# Patient Record
Sex: Male | Born: 1940 | Race: White | Hispanic: No | Marital: Married | Smoking: Former smoker
Health system: Southern US, Community
[De-identification: ages and names within clinical notes are randomized; demographics above are authoritative.]

## PROBLEM LIST (undated history)

## (undated) DIAGNOSIS — M1990 Unspecified osteoarthritis, unspecified site: Secondary | ICD-10-CM

## (undated) DIAGNOSIS — Z8619 Personal history of other infectious and parasitic diseases: Secondary | ICD-10-CM

## (undated) DIAGNOSIS — C801 Malignant (primary) neoplasm, unspecified: Secondary | ICD-10-CM

## (undated) DIAGNOSIS — M26609 Unspecified temporomandibular joint disorder, unspecified side: Secondary | ICD-10-CM

## (undated) DIAGNOSIS — Z87442 Personal history of urinary calculi: Secondary | ICD-10-CM

## (undated) DIAGNOSIS — I10 Essential (primary) hypertension: Secondary | ICD-10-CM

## (undated) DIAGNOSIS — Z8501 Personal history of malignant neoplasm of esophagus: Secondary | ICD-10-CM

## (undated) DIAGNOSIS — Z974 Presence of external hearing-aid: Secondary | ICD-10-CM

## (undated) HISTORY — PX: TONSILLECTOMY AND ADENOIDECTOMY: SHX28

## (undated) HISTORY — PX: VASECTOMY: SHX75

## (undated) HISTORY — DX: Personal history of other infectious and parasitic diseases: Z86.19

## (undated) HISTORY — PX: APPENDECTOMY: SHX54

## (undated) HISTORY — DX: Personal history of urinary calculi: Z87.442

## (undated) HISTORY — DX: Essential (primary) hypertension: I10

## (undated) HISTORY — DX: Personal history of malignant neoplasm of esophagus: Z85.01

---
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
## 1968-03-29 HISTORY — PX: CHOLECYSTECTOMY: SHX55

## 1968-03-29 HISTORY — PX: HERNIA REPAIR: SHX51

## 1999-10-27 LAB — HM DEXA SCAN

## 2001-03-29 HISTORY — PX: OTHER SURGICAL HISTORY: SHX169

## 2007-08-03 DIAGNOSIS — I10 Essential (primary) hypertension: Secondary | ICD-10-CM | POA: Insufficient documentation

## 2008-03-27 ENCOUNTER — Emergency Department (HOSPITAL_COMMUNITY): Admission: EM | Admit: 2008-03-27 | Discharge: 2008-03-27 | Payer: Self-pay | Admitting: Emergency Medicine

## 2011-01-01 LAB — CBC
MCHC: 34.3 g/dL (ref 30.0–36.0)
MCV: 95 fL (ref 78.0–100.0)
Platelets: 203 10*3/uL (ref 150–400)
RDW: 13.1 % (ref 11.5–15.5)

## 2011-01-01 LAB — URINALYSIS, ROUTINE W REFLEX MICROSCOPIC
Bilirubin Urine: NEGATIVE
Hgb urine dipstick: NEGATIVE
Protein, ur: NEGATIVE mg/dL
Urobilinogen, UA: 0.2 mg/dL (ref 0.0–1.0)

## 2011-01-01 LAB — DIFFERENTIAL
Eosinophils Relative: 3 % (ref 0–5)
Lymphocytes Relative: 25 % (ref 12–46)
Lymphs Abs: 2.1 10*3/uL (ref 0.7–4.0)
Monocytes Absolute: 0.8 10*3/uL (ref 0.1–1.0)

## 2011-01-01 LAB — COMPREHENSIVE METABOLIC PANEL
AST: 24 U/L (ref 0–37)
Albumin: 4.1 g/dL (ref 3.5–5.2)
Calcium: 9 mg/dL (ref 8.4–10.5)
Creatinine, Ser: 1.15 mg/dL (ref 0.4–1.5)
GFR calc Af Amer: >60 mL/min (ref >60)

## 2011-01-01 LAB — LIPASE, BLOOD: Lipase: 25 U/L (ref 11–59)

## 2011-01-18 ENCOUNTER — Ambulatory Visit: Payer: Self-pay | Admitting: Family Medicine

## 2011-05-14 DIAGNOSIS — M542 Cervicalgia: Secondary | ICD-10-CM | POA: Diagnosis not present

## 2011-05-18 DIAGNOSIS — M542 Cervicalgia: Secondary | ICD-10-CM | POA: Diagnosis not present

## 2011-05-24 DIAGNOSIS — M542 Cervicalgia: Secondary | ICD-10-CM | POA: Diagnosis not present

## 2011-05-27 DIAGNOSIS — M542 Cervicalgia: Secondary | ICD-10-CM | POA: Diagnosis not present

## 2011-06-01 DIAGNOSIS — M542 Cervicalgia: Secondary | ICD-10-CM | POA: Diagnosis not present

## 2011-06-03 DIAGNOSIS — M542 Cervicalgia: Secondary | ICD-10-CM | POA: Diagnosis not present

## 2011-06-08 DIAGNOSIS — M542 Cervicalgia: Secondary | ICD-10-CM | POA: Diagnosis not present

## 2011-06-10 DIAGNOSIS — M542 Cervicalgia: Secondary | ICD-10-CM | POA: Diagnosis not present

## 2011-06-17 DIAGNOSIS — M542 Cervicalgia: Secondary | ICD-10-CM | POA: Diagnosis not present

## 2011-06-22 DIAGNOSIS — M542 Cervicalgia: Secondary | ICD-10-CM | POA: Diagnosis not present

## 2011-06-29 DIAGNOSIS — M542 Cervicalgia: Secondary | ICD-10-CM | POA: Diagnosis not present

## 2011-07-01 DIAGNOSIS — M542 Cervicalgia: Secondary | ICD-10-CM | POA: Diagnosis not present

## 2011-07-02 DIAGNOSIS — M5412 Radiculopathy, cervical region: Secondary | ICD-10-CM | POA: Diagnosis not present

## 2011-07-02 DIAGNOSIS — M542 Cervicalgia: Secondary | ICD-10-CM | POA: Diagnosis not present

## 2011-07-06 DIAGNOSIS — M5412 Radiculopathy, cervical region: Secondary | ICD-10-CM | POA: Diagnosis not present

## 2011-07-06 DIAGNOSIS — M542 Cervicalgia: Secondary | ICD-10-CM | POA: Diagnosis not present

## 2011-07-08 DIAGNOSIS — M5412 Radiculopathy, cervical region: Secondary | ICD-10-CM | POA: Diagnosis not present

## 2011-07-08 DIAGNOSIS — M542 Cervicalgia: Secondary | ICD-10-CM | POA: Diagnosis not present

## 2011-07-08 DIAGNOSIS — M4802 Spinal stenosis, cervical region: Secondary | ICD-10-CM | POA: Diagnosis not present

## 2011-07-16 DIAGNOSIS — M5020 Other cervical disc displacement, unspecified cervical region: Secondary | ICD-10-CM | POA: Diagnosis not present

## 2011-07-19 DIAGNOSIS — M5412 Radiculopathy, cervical region: Secondary | ICD-10-CM | POA: Diagnosis not present

## 2011-07-22 DIAGNOSIS — M5412 Radiculopathy, cervical region: Secondary | ICD-10-CM | POA: Diagnosis not present

## 2011-07-26 DIAGNOSIS — M5412 Radiculopathy, cervical region: Secondary | ICD-10-CM | POA: Diagnosis not present

## 2011-11-09 DIAGNOSIS — C269 Malignant neoplasm of ill-defined sites within the digestive system: Secondary | ICD-10-CM | POA: Insufficient documentation

## 2011-11-09 DIAGNOSIS — C494 Malignant neoplasm of connective and soft tissue of abdomen: Secondary | ICD-10-CM | POA: Diagnosis not present

## 2011-11-09 DIAGNOSIS — K222 Esophageal obstruction: Secondary | ICD-10-CM | POA: Insufficient documentation

## 2011-11-09 DIAGNOSIS — C159 Malignant neoplasm of esophagus, unspecified: Secondary | ICD-10-CM | POA: Diagnosis not present

## 2012-06-12 DIAGNOSIS — I10 Essential (primary) hypertension: Secondary | ICD-10-CM | POA: Diagnosis not present

## 2012-06-23 DIAGNOSIS — I10 Essential (primary) hypertension: Secondary | ICD-10-CM | POA: Diagnosis not present

## 2012-06-23 DIAGNOSIS — Z1212 Encounter for screening for malignant neoplasm of rectum: Secondary | ICD-10-CM | POA: Diagnosis not present

## 2012-06-23 DIAGNOSIS — Z0000 Encounter for general adult medical examination without abnormal findings: Secondary | ICD-10-CM | POA: Diagnosis not present

## 2012-06-23 DIAGNOSIS — Z8501 Personal history of malignant neoplasm of esophagus: Secondary | ICD-10-CM | POA: Diagnosis not present

## 2012-08-02 DIAGNOSIS — H43819 Vitreous degeneration, unspecified eye: Secondary | ICD-10-CM | POA: Diagnosis not present

## 2013-06-25 DIAGNOSIS — Z23 Encounter for immunization: Secondary | ICD-10-CM | POA: Diagnosis not present

## 2013-10-25 DIAGNOSIS — K7689 Other specified diseases of liver: Secondary | ICD-10-CM | POA: Diagnosis not present

## 2013-10-25 DIAGNOSIS — K449 Diaphragmatic hernia without obstruction or gangrene: Secondary | ICD-10-CM | POA: Diagnosis not present

## 2013-10-25 DIAGNOSIS — Z01811 Encounter for preprocedural respiratory examination: Secondary | ICD-10-CM | POA: Diagnosis not present

## 2013-10-25 DIAGNOSIS — N201 Calculus of ureter: Secondary | ICD-10-CM | POA: Diagnosis not present

## 2013-10-26 DIAGNOSIS — N209 Urinary calculus, unspecified: Secondary | ICD-10-CM | POA: Diagnosis not present

## 2013-10-26 DIAGNOSIS — N200 Calculus of kidney: Secondary | ICD-10-CM | POA: Diagnosis not present

## 2013-10-26 DIAGNOSIS — I10 Essential (primary) hypertension: Secondary | ICD-10-CM | POA: Diagnosis not present

## 2013-10-26 DIAGNOSIS — N201 Calculus of ureter: Secondary | ICD-10-CM | POA: Diagnosis not present

## 2013-10-27 DIAGNOSIS — I498 Other specified cardiac arrhythmias: Secondary | ICD-10-CM | POA: Diagnosis not present

## 2013-10-29 ENCOUNTER — Encounter: Payer: Self-pay | Admitting: *Deleted

## 2013-11-02 DIAGNOSIS — Z09 Encounter for follow-up examination after completed treatment for conditions other than malignant neoplasm: Secondary | ICD-10-CM | POA: Diagnosis not present

## 2013-11-02 DIAGNOSIS — C494 Malignant neoplasm of connective and soft tissue of abdomen: Secondary | ICD-10-CM | POA: Diagnosis not present

## 2013-11-02 DIAGNOSIS — Z8589 Personal history of malignant neoplasm of other organs and systems: Secondary | ICD-10-CM | POA: Diagnosis not present

## 2013-11-06 DIAGNOSIS — K222 Esophageal obstruction: Secondary | ICD-10-CM | POA: Diagnosis not present

## 2013-11-06 DIAGNOSIS — K4091 Unilateral inguinal hernia, without obstruction or gangrene, recurrent: Secondary | ICD-10-CM | POA: Diagnosis not present

## 2013-11-06 DIAGNOSIS — C494 Malignant neoplasm of connective and soft tissue of abdomen: Secondary | ICD-10-CM | POA: Diagnosis not present

## 2013-11-08 DIAGNOSIS — N201 Calculus of ureter: Secondary | ICD-10-CM | POA: Diagnosis not present

## 2013-11-08 DIAGNOSIS — N200 Calculus of kidney: Secondary | ICD-10-CM | POA: Diagnosis not present

## 2013-11-13 ENCOUNTER — Ambulatory Visit: Payer: Self-pay | Admitting: General Surgery

## 2013-11-20 DIAGNOSIS — N200 Calculus of kidney: Secondary | ICD-10-CM | POA: Diagnosis not present

## 2013-11-22 ENCOUNTER — Encounter: Payer: Self-pay | Admitting: *Deleted

## 2013-11-26 DIAGNOSIS — I10 Essential (primary) hypertension: Secondary | ICD-10-CM | POA: Diagnosis not present

## 2013-11-26 DIAGNOSIS — Z87442 Personal history of urinary calculi: Secondary | ICD-10-CM | POA: Diagnosis not present

## 2013-11-26 DIAGNOSIS — K4090 Unilateral inguinal hernia, without obstruction or gangrene, not specified as recurrent: Secondary | ICD-10-CM | POA: Diagnosis not present

## 2013-11-26 DIAGNOSIS — Z1212 Encounter for screening for malignant neoplasm of rectum: Secondary | ICD-10-CM | POA: Diagnosis not present

## 2013-12-30 DIAGNOSIS — K4090 Unilateral inguinal hernia, without obstruction or gangrene, not specified as recurrent: Secondary | ICD-10-CM | POA: Insufficient documentation

## 2013-12-31 DIAGNOSIS — K4090 Unilateral inguinal hernia, without obstruction or gangrene, not specified as recurrent: Secondary | ICD-10-CM | POA: Diagnosis not present

## 2014-01-21 DIAGNOSIS — Z23 Encounter for immunization: Secondary | ICD-10-CM | POA: Diagnosis not present

## 2014-02-21 DIAGNOSIS — H43813 Vitreous degeneration, bilateral: Secondary | ICD-10-CM | POA: Diagnosis not present

## 2014-05-24 DIAGNOSIS — K4090 Unilateral inguinal hernia, without obstruction or gangrene, not specified as recurrent: Secondary | ICD-10-CM | POA: Diagnosis not present

## 2014-05-24 DIAGNOSIS — I10 Essential (primary) hypertension: Secondary | ICD-10-CM | POA: Diagnosis not present

## 2014-05-24 DIAGNOSIS — Z8500 Personal history of malignant neoplasm of unspecified digestive organ: Secondary | ICD-10-CM | POA: Diagnosis not present

## 2014-06-19 DIAGNOSIS — L570 Actinic keratosis: Secondary | ICD-10-CM | POA: Diagnosis not present

## 2014-06-19 DIAGNOSIS — L578 Other skin changes due to chronic exposure to nonionizing radiation: Secondary | ICD-10-CM | POA: Diagnosis not present

## 2014-06-19 DIAGNOSIS — L820 Inflamed seborrheic keratosis: Secondary | ICD-10-CM | POA: Diagnosis not present

## 2014-06-19 DIAGNOSIS — L821 Other seborrheic keratosis: Secondary | ICD-10-CM | POA: Diagnosis not present

## 2014-07-08 DIAGNOSIS — K4090 Unilateral inguinal hernia, without obstruction or gangrene, not specified as recurrent: Secondary | ICD-10-CM | POA: Diagnosis not present

## 2014-08-09 DIAGNOSIS — H2513 Age-related nuclear cataract, bilateral: Secondary | ICD-10-CM | POA: Diagnosis not present

## 2014-08-09 DIAGNOSIS — H43813 Vitreous degeneration, bilateral: Secondary | ICD-10-CM | POA: Diagnosis not present

## 2014-08-14 DIAGNOSIS — L570 Actinic keratosis: Secondary | ICD-10-CM | POA: Diagnosis not present

## 2014-08-14 DIAGNOSIS — L820 Inflamed seborrheic keratosis: Secondary | ICD-10-CM | POA: Diagnosis not present

## 2014-09-27 DIAGNOSIS — H2513 Age-related nuclear cataract, bilateral: Secondary | ICD-10-CM | POA: Diagnosis not present

## 2014-09-27 DIAGNOSIS — H43813 Vitreous degeneration, bilateral: Secondary | ICD-10-CM | POA: Diagnosis not present

## 2015-01-17 DIAGNOSIS — Z23 Encounter for immunization: Secondary | ICD-10-CM | POA: Diagnosis not present

## 2015-04-03 DIAGNOSIS — M1612 Unilateral primary osteoarthritis, left hip: Secondary | ICD-10-CM | POA: Diagnosis not present

## 2015-04-03 DIAGNOSIS — S76012A Strain of muscle, fascia and tendon of left hip, initial encounter: Secondary | ICD-10-CM | POA: Diagnosis not present

## 2015-04-03 DIAGNOSIS — M25552 Pain in left hip: Secondary | ICD-10-CM | POA: Diagnosis not present

## 2015-04-03 DIAGNOSIS — M7542 Impingement syndrome of left shoulder: Secondary | ICD-10-CM | POA: Diagnosis not present

## 2015-04-25 DIAGNOSIS — M7062 Trochanteric bursitis, left hip: Secondary | ICD-10-CM | POA: Diagnosis not present

## 2015-04-25 DIAGNOSIS — M25612 Stiffness of left shoulder, not elsewhere classified: Secondary | ICD-10-CM | POA: Diagnosis not present

## 2015-04-25 DIAGNOSIS — M25552 Pain in left hip: Secondary | ICD-10-CM | POA: Diagnosis not present

## 2015-04-25 DIAGNOSIS — R2689 Other abnormalities of gait and mobility: Secondary | ICD-10-CM | POA: Diagnosis not present

## 2015-04-25 DIAGNOSIS — M25512 Pain in left shoulder: Secondary | ICD-10-CM | POA: Diagnosis not present

## 2015-05-01 ENCOUNTER — Ambulatory Visit (INDEPENDENT_AMBULATORY_CARE_PROVIDER_SITE_OTHER): Payer: Medicare Other | Admitting: Physician Assistant

## 2015-05-01 ENCOUNTER — Encounter: Payer: Self-pay | Admitting: Physician Assistant

## 2015-05-01 VITALS — BP 130/60 | HR 95 | Temp 99.4°F | Resp 16 | Wt 158.0 lb

## 2015-05-01 DIAGNOSIS — R197 Diarrhea, unspecified: Secondary | ICD-10-CM | POA: Diagnosis not present

## 2015-05-01 DIAGNOSIS — Z8619 Personal history of other infectious and parasitic diseases: Secondary | ICD-10-CM

## 2015-05-01 DIAGNOSIS — I10 Essential (primary) hypertension: Secondary | ICD-10-CM | POA: Diagnosis not present

## 2015-05-01 DIAGNOSIS — R110 Nausea: Secondary | ICD-10-CM | POA: Diagnosis not present

## 2015-05-01 MED ORDER — METOPROLOL SUCCINATE ER 50 MG PO TB24: 50.0000 mg | ORAL_TABLET | Freq: Every day | ORAL | Status: DC

## 2015-05-01 MED ORDER — ONDANSETRON HCL 4 MG PO TABS: 4.0000 mg | ORAL_TABLET | Freq: Three times a day (TID) | ORAL | Status: DC | PRN

## 2015-05-01 MED ORDER — AMLODIPINE BESY-BENAZEPRIL HCL 5-20 MG PO CAPS: 1.0000 | ORAL_CAPSULE | Freq: Every day | ORAL | Status: DC

## 2015-05-01 MED ORDER — CIPROFLOXACIN HCL 500 MG PO TABS: 500.0000 mg | ORAL_TABLET | Freq: Two times a day (BID) | ORAL | Status: DC

## 2015-05-01 NOTE — Patient Instructions (Signed)
Diarrhea Diarrhea is frequent loose and watery bowel movements. It can cause you to feel weak and dehydrated. Dehydration can cause you to become tired and thirsty, have a dry mouth, and have decreased urination that often is dark yellow. Diarrhea is a sign of another problem, most often an infection that will not last long. In most cases, diarrhea typically lasts 2-3 days. However, it can last longer if it is a sign of something more serious. It is important to treat your diarrhea as directed by your caregiver to lessen or prevent future episodes of diarrhea. CAUSES  Some common causes include:  Gastrointestinal infections caused by viruses, bacteria, or parasites.  Food poisoning or food allergies.  Certain medicines, such as antibiotics, chemotherapy, and laxatives.  Artificial sweeteners and fructose.  Digestive disorders. HOME CARE INSTRUCTIONS  Ensure adequate fluid intake (hydration): Have 1 cup (8 oz) of fluid for each diarrhea episode. Avoid fluids that contain simple sugars or sports drinks, fruit juices, whole milk products, and sodas. Your urine should be clear or pale yellow if you are drinking enough fluids. Hydrate with an oral rehydration solution that you can purchase at pharmacies, retail stores, and online. You can prepare an oral rehydration solution at home by mixing the following ingredients together:   - tsp table salt.   tsp baking soda.   tsp salt substitute containing potassium chloride.  1  tablespoons sugar.  1 L (34 oz) of water.  Certain foods and beverages may increase the speed at which food moves through the gastrointestinal (GI) tract. These foods and beverages should be avoided and include:  Caffeinated and alcoholic beverages.  High-fiber foods, such as raw fruits and vegetables, nuts, seeds, and whole grain breads and cereals.  Foods and beverages sweetened with sugar alcohols, such as xylitol, sorbitol, and mannitol.  Some foods may be well  tolerated and may help thicken stool including:  Starchy foods, such as rice, toast, pasta, low-sugar cereal, oatmeal, grits, baked potatoes, crackers, and bagels.  Bananas.  Applesauce.  Add probiotic-rich foods to help increase healthy bacteria in the GI tract, such as yogurt and fermented milk products.  Wash your hands well after each diarrhea episode.  Only take over-the-counter or prescription medicines as directed by your caregiver.  Take a warm bath to relieve any burning or pain from frequent diarrhea episodes. SEEK IMMEDIATE MEDICAL CARE IF:   You are unable to keep fluids down.  You have persistent vomiting.  You have blood in your stool, or your stools are black and tarry.  You do not urinate in 6-8 hours, or there is only a small amount of very dark urine.  You have abdominal pain that increases or localizes.  You have weakness, dizziness, confusion, or light-headedness.  You have a severe headache.  Your diarrhea gets worse or does not get better.  You have a fever or persistent symptoms for more than 2-3 days.  You have a fever and your symptoms suddenly get worse. MAKE SURE YOU:   Understand these instructions.  Will watch your condition.  Will get help right away if you are not doing well or get worse.   This information is not intended to replace advice given to you by your health care provider. Make sure you discuss any questions you have with your health care provider.   Document Released: 03/05/2002 Document Revised: 04/05/2014 Document Reviewed: 11/21/2011 Elsevier Interactive Patient Education 2016 Elsevier Inc.   Food Choices to Help Relieve Diarrhea, Adult When you have   diarrhea, the foods you eat and your eating habits are very important. Choosing the right foods and drinks can help relieve diarrhea. Also, because diarrhea can last up to 7 days, you need to replace lost fluids and electrolytes (such as sodium, potassium, and chloride) in  order to help prevent dehydration.  WHAT GENERAL GUIDELINES DO I NEED TO FOLLOW?  Slowly drink 1 cup (8 oz) of fluid for each episode of diarrhea. If you are getting enough fluid, your urine will be clear or pale yellow.  Eat starchy foods. Some good choices include white rice, white toast, pasta, low-fiber cereal, baked potatoes (without the skin), saltine crackers, and bagels.  Avoid large servings of any cooked vegetables.  Limit fruit to two servings per day. A serving is  cup or 1 small piece.  Choose foods with less than 2 g of fiber per serving.  Limit fats to less than 8 tsp (38 g) per day.  Avoid fried foods.  Eat foods that have probiotics in them. Probiotics can be found in certain dairy products.  Avoid foods and beverages that may increase the speed at which food moves through the stomach and intestines (gastrointestinal tract). Things to avoid include:  High-fiber foods, such as dried fruit, raw fruits and vegetables, nuts, seeds, and whole grain foods.  Spicy foods and high-fat foods.  Foods and beverages sweetened with high-fructose corn syrup, honey, or sugar alcohols such as xylitol, sorbitol, and mannitol. WHAT FOODS ARE RECOMMENDED? Grains White rice. White, French, or pita breads (fresh or toasted), including plain rolls, buns, or bagels. White pasta. Saltine, soda, or graham crackers. Pretzels. Low-fiber cereal. Cooked cereals made with water (such as cornmeal, farina, or cream cereals). Plain muffins. Matzo. Melba toast. Zwieback.  Vegetables Potatoes (without the skin). Strained tomato and vegetable juices. Most well-cooked and canned vegetables without seeds. Tender lettuce. Fruits Cooked or canned applesauce, apricots, cherries, fruit cocktail, grapefruit, peaches, pears, or plums. Fresh bananas, apples without skin, cherries, grapes, cantaloupe, grapefruit, peaches, oranges, or plums.  Meat and Other Protein Products Baked or boiled chicken. Eggs. Tofu.  Fish. Seafood. Smooth peanut butter. Ground or well-cooked tender beef, ham, veal, lamb, pork, or poultry.  Dairy Plain yogurt, kefir, and unsweetened liquid yogurt. Lactose-free milk, buttermilk, or soy milk. Plain hard cheese. Beverages Sport drinks. Clear broths. Diluted fruit juices (except prune). Regular, caffeine-free sodas such as ginger ale. Water. Decaffeinated teas. Oral rehydration solutions. Sugar-free beverages not sweetened with sugar alcohols. Other Bouillon, broth, or soups made from recommended foods.  The items listed above may not be a complete list of recommended foods or beverages. Contact your dietitian for more options. WHAT FOODS ARE NOT RECOMMENDED? Grains Whole grain, whole wheat, bran, or rye breads, rolls, pastas, crackers, and cereals. Wild or brown rice. Cereals that contain more than 2 g of fiber per serving. Corn tortillas or taco shells. Cooked or dry oatmeal. Granola. Popcorn. Vegetables Raw vegetables. Cabbage, broccoli, Brussels sprouts, artichokes, baked beans, beet greens, corn, kale, legumes, peas, sweet potatoes, and yams. Potato skins. Cooked spinach and cabbage. Fruits Dried fruit, including raisins and dates. Raw fruits. Stewed or dried prunes. Fresh apples with skin, apricots, mangoes, pears, raspberries, and strawberries.  Meat and Other Protein Products Chunky peanut butter. Nuts and seeds. Beans and lentils. Bacon.  Dairy High-fat cheeses. Milk, chocolate milk, and beverages made with milk, such as milk shakes. Cream. Ice cream. Sweets and Desserts Sweet rolls, doughnuts, and sweet breads. Pancakes and waffles. Fats and Oils Butter. Cream sauces.   Margarine. Salad oils. Plain salad dressings. Olives. Avocados.  Beverages Caffeinated beverages (such as coffee, tea, soda, or energy drinks). Alcoholic beverages. Fruit juices with pulp. Prune juice. Soft drinks sweetened with high-fructose corn syrup or sugar alcohols. Other Coconut. Hot sauce.  Chili powder. Mayonnaise. Gravy. Cream-based or milk-based soups.  The items listed above may not be a complete list of foods and beverages to avoid. Contact your dietitian for more information. WHAT SHOULD I DO IF I BECOME DEHYDRATED? Diarrhea can sometimes lead to dehydration. Signs of dehydration include dark urine and dry mouth and skin. If you think you are dehydrated, you should rehydrate with an oral rehydration solution. These solutions can be purchased at pharmacies, retail stores, or online.  Drink -1 cup (120-240 mL) of oral rehydration solution each time you have an episode of diarrhea. If drinking this amount makes your diarrhea worse, try drinking smaller amounts more often. For example, drink 1-3 tsp (5-15 mL) every 5-10 minutes.  A general rule for staying hydrated is to drink 1-2 L of fluid per day. Talk to your health care provider about the specific amount you should be drinking each day. Drink enough fluids to keep your urine clear or pale yellow.   This information is not intended to replace advice given to you by your health care provider. Make sure you discuss any questions you have with your health care provider.   Document Released: 06/05/2003 Document Revised: 04/05/2014 Document Reviewed: 02/05/2013 Elsevier Interactive Patient Education 2016 Elsevier Inc.   

## 2015-05-01 NOTE — Progress Notes (Signed)
Patient: [name] Male    DOB: May 23, 1940   75 y.o.   MRN: [id number] Visit Date: 05/01/2015  Today's Provider: Mar Daring, PA-C   Chief Complaint  Patient presents with  . Abdominal Pain  . Diarrhea   Subjective:    Abdominal Pain This is a new problem. The current episode started in the past 7 days (for the past two days). The onset quality is sudden. The problem occurs constantly. The problem has been unchanged. The pain is located in the RLQ and LLQ. The pain is at a severity of 4/10. The pain is mild. The quality of the pain is cramping and aching. The abdominal pain does not radiate. Associated symptoms include diarrhea, a fever and headaches. Pertinent negatives include no constipation, nausea or vomiting. Associated symptoms comments: Low grade fever 99.7. The pain is aggravated by palpation. The pain is relieved by liquids. He has tried nothing for the symptoms.  Diarrhea  This is a new problem. The current episode started in the past 7 days (For the past two days). The problem occurs less than 2 times per day. The problem has been gradually worsening. The stool consistency is described as watery (it was watery). Associated symptoms include abdominal pain, bloating, a fever and headaches. Pertinent negatives include no chills, coughing or vomiting. There are no known risk factors. He has tried increased fluids for the symptoms. The treatment provided no relief.  Had large episode of diarrhea twice 2 days ago.  One loose stool yesterday and none since. 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  This was done at [facility] and he is followed every 2 years with CT scans.      Allergies  Allergen Reactions  . Codeine Phosphate [Codeine] Other (See Comments) and Hives    Dizziness   Previous Medications   AMLODIPINE-BENAZEPRIL (LOTREL) 5-20 MG CAPSULE    Take by mouth.   FAMOTIDINE-CALCIUM  CARBONATE-MAGNESIUM HYDROXIDE (PEPCID COMPLETE) [phone] MG CHEWABLE TABLET    Chew by mouth daily as needed (as needed).    METOPROLOL SUCCINATE (TOPROL-XL) 50 MG 24 HR TABLET       MULTIPLE VITAMINS TABLET    Take by mouth.    Review of Systems  Constitutional: Positive for fever and fatigue. Negative for chills.  Respiratory: Negative for cough, chest tightness, shortness of breath and wheezing.   Cardiovascular: Negative for chest pain, palpitations and leg swelling.  Gastrointestinal: Positive for abdominal pain, diarrhea and bloating. Negative for nausea, vomiting, constipation, blood in stool and anal bleeding.  Musculoskeletal: Negative.   Neurological: Positive for headaches. Negative for dizziness, syncope and weakness.  All other systems reviewed and are negative.   Social History  Substance Use Topics  . Smoking status: Former Research scientist (life sciences)  . Smokeless tobacco: Not on file  . Alcohol Use: No   Objective:   BP 130/60 mmHg  Pulse 95  Temp(Src) 99.4 F (37.4 C) (Oral)  Resp 16  Wt 158 lb (71.668 kg)  Physical Exam  Constitutional: He is oriented to person, place, and time. He appears well-developed and well-nourished. No distress.  HENT:  Head: Normocephalic and atraumatic.  Mouth/Throat: Oropharynx is clear and moist. No oropharyngeal exudate.  Neck: Normal range of motion. Neck supple. No tracheal deviation present. No thyromegaly present.  Cardiovascular: Normal rate, regular rhythm and normal heart sounds.  Exam reveals no gallop and no friction rub.   No murmur  heard. Pulmonary/Chest: Effort normal and breath sounds normal. No respiratory distress. He has no wheezes. He has no rales.  Abdominal: Soft. Normal appearance and bowel sounds are normal. He exhibits no distension and no mass. There is no hepatosplenomegaly. There is generalized tenderness. There is no rebound, no guarding and no CVA tenderness.  Lymphadenopathy:    He has no cervical adenopathy.    Neurological: He is alert and oriented to person, place, and time.  Skin: Skin is warm and dry. He is not diaphoretic.  Vitals reviewed.       Assessment & Plan:     1. Diarrhea, unspecified type Explained that this is most likely viral and self-limited.  He should stay well hydrated.  May use tylenol for fevers and pain.  May continue to use pepto bismol if it is helping diarrhea and abdominal cramping. Will give cipro that he may fill if necessary if bowel movements increase in frequency or become bloody over the weekend. If not he does not have to fill this medication. Also warned that if he is to develop inability to pass gas for him to go to the hospital as he may have a bowel obstruction. He voiced understanding. He is to call the office if symptoms fail to improve or worsen. - ciprofloxacin (CIPRO) 500 MG tablet; Take 1 tablet (500 mg total) by mouth 2 (two) times daily.  Dispense: 6 tablet; Refill: 0  2. Nausea Will give zofran as needed for symptomatic relief of nausea. - ondansetron (ZOFRAN) 4 MG tablet; Take 1 tablet (4 mg total) by mouth every 8 (eight) hours as needed for nausea or vomiting.  Dispense: 20 tablet; Refill: 0  3. Essential hypertension Stable.  Diagnosis pulled to refill medications. Continue current medical treatment plan. - amLODipine-benazepril (LOTREL) 5-20 MG capsule; Take 1 capsule by mouth daily.  Dispense: 90 capsule; Refill: 3 - metoprolol succinate (TOPROL-XL) 50 MG 24 hr tablet; Take 1 tablet (50 mg total) by mouth daily.  Dispense: 90 tablet; Refill: [place], PA-C  [facility]

## 2015-05-06 DIAGNOSIS — M25512 Pain in left shoulder: Secondary | ICD-10-CM | POA: Diagnosis not present

## 2015-05-06 DIAGNOSIS — M25552 Pain in left hip: Secondary | ICD-10-CM | POA: Diagnosis not present

## 2015-05-08 DIAGNOSIS — M25512 Pain in left shoulder: Secondary | ICD-10-CM | POA: Diagnosis not present

## 2015-05-08 DIAGNOSIS — M25552 Pain in left hip: Secondary | ICD-10-CM | POA: Diagnosis not present

## 2015-05-08 DIAGNOSIS — R2689 Other abnormalities of gait and mobility: Secondary | ICD-10-CM | POA: Diagnosis not present

## 2015-05-30 DIAGNOSIS — M25512 Pain in left shoulder: Secondary | ICD-10-CM | POA: Diagnosis not present

## 2015-05-30 DIAGNOSIS — M25552 Pain in left hip: Secondary | ICD-10-CM | POA: Diagnosis not present

## 2015-06-03 ENCOUNTER — Encounter: Payer: Self-pay | Admitting: *Deleted

## 2015-06-03 DIAGNOSIS — M25512 Pain in left shoulder: Secondary | ICD-10-CM | POA: Diagnosis not present

## 2015-06-03 DIAGNOSIS — M25552 Pain in left hip: Secondary | ICD-10-CM | POA: Diagnosis not present

## 2015-06-05 DIAGNOSIS — M25552 Pain in left hip: Secondary | ICD-10-CM | POA: Diagnosis not present

## 2015-06-05 DIAGNOSIS — M25512 Pain in left shoulder: Secondary | ICD-10-CM | POA: Diagnosis not present

## 2015-06-10 DIAGNOSIS — M25552 Pain in left hip: Secondary | ICD-10-CM | POA: Diagnosis not present

## 2015-06-10 DIAGNOSIS — M25512 Pain in left shoulder: Secondary | ICD-10-CM | POA: Diagnosis not present

## 2015-06-12 DIAGNOSIS — M6281 Muscle weakness (generalized): Secondary | ICD-10-CM | POA: Diagnosis not present

## 2015-06-19 DIAGNOSIS — M25552 Pain in left hip: Secondary | ICD-10-CM | POA: Diagnosis not present

## 2015-06-19 DIAGNOSIS — M25512 Pain in left shoulder: Secondary | ICD-10-CM | POA: Diagnosis not present

## 2015-06-25 DIAGNOSIS — M25512 Pain in left shoulder: Secondary | ICD-10-CM | POA: Diagnosis not present

## 2015-06-25 DIAGNOSIS — M25552 Pain in left hip: Secondary | ICD-10-CM | POA: Diagnosis not present

## 2015-07-01 DIAGNOSIS — M25512 Pain in left shoulder: Secondary | ICD-10-CM | POA: Diagnosis not present

## 2015-07-01 DIAGNOSIS — M25552 Pain in left hip: Secondary | ICD-10-CM | POA: Diagnosis not present

## 2015-07-03 DIAGNOSIS — M25512 Pain in left shoulder: Secondary | ICD-10-CM | POA: Diagnosis not present

## 2015-07-03 DIAGNOSIS — M25552 Pain in left hip: Secondary | ICD-10-CM | POA: Diagnosis not present

## 2015-07-07 DIAGNOSIS — M25552 Pain in left hip: Secondary | ICD-10-CM | POA: Diagnosis not present

## 2015-07-07 DIAGNOSIS — M25512 Pain in left shoulder: Secondary | ICD-10-CM | POA: Diagnosis not present

## 2015-07-15 DIAGNOSIS — M25512 Pain in left shoulder: Secondary | ICD-10-CM | POA: Diagnosis not present

## 2015-07-15 DIAGNOSIS — M25552 Pain in left hip: Secondary | ICD-10-CM | POA: Diagnosis not present

## 2015-07-18 DIAGNOSIS — M25512 Pain in left shoulder: Secondary | ICD-10-CM | POA: Diagnosis not present

## 2015-07-18 DIAGNOSIS — M25552 Pain in left hip: Secondary | ICD-10-CM | POA: Diagnosis not present

## 2015-07-22 DIAGNOSIS — M25512 Pain in left shoulder: Secondary | ICD-10-CM | POA: Diagnosis not present

## 2015-07-22 DIAGNOSIS — M25552 Pain in left hip: Secondary | ICD-10-CM | POA: Diagnosis not present

## 2015-09-02 DIAGNOSIS — M1612 Unilateral primary osteoarthritis, left hip: Secondary | ICD-10-CM | POA: Diagnosis not present

## 2015-09-02 DIAGNOSIS — M7062 Trochanteric bursitis, left hip: Secondary | ICD-10-CM | POA: Diagnosis not present

## 2015-10-03 DIAGNOSIS — H2513 Age-related nuclear cataract, bilateral: Secondary | ICD-10-CM | POA: Diagnosis not present

## 2015-10-07 DIAGNOSIS — R2689 Other abnormalities of gait and mobility: Secondary | ICD-10-CM | POA: Diagnosis not present

## 2015-10-07 DIAGNOSIS — M25552 Pain in left hip: Secondary | ICD-10-CM | POA: Diagnosis not present

## 2015-10-10 DIAGNOSIS — M25652 Stiffness of left hip, not elsewhere classified: Secondary | ICD-10-CM | POA: Diagnosis not present

## 2015-10-10 DIAGNOSIS — M1612 Unilateral primary osteoarthritis, left hip: Secondary | ICD-10-CM | POA: Diagnosis not present

## 2015-10-10 DIAGNOSIS — M25552 Pain in left hip: Secondary | ICD-10-CM | POA: Diagnosis not present

## 2015-10-14 DIAGNOSIS — M25652 Stiffness of left hip, not elsewhere classified: Secondary | ICD-10-CM | POA: Diagnosis not present

## 2015-10-14 DIAGNOSIS — M25552 Pain in left hip: Secondary | ICD-10-CM | POA: Diagnosis not present

## 2015-10-15 DIAGNOSIS — M25552 Pain in left hip: Secondary | ICD-10-CM | POA: Diagnosis not present

## 2015-10-15 DIAGNOSIS — M1612 Unilateral primary osteoarthritis, left hip: Secondary | ICD-10-CM | POA: Diagnosis not present

## 2015-10-17 DIAGNOSIS — M1612 Unilateral primary osteoarthritis, left hip: Secondary | ICD-10-CM | POA: Diagnosis not present

## 2015-10-17 DIAGNOSIS — M25552 Pain in left hip: Secondary | ICD-10-CM | POA: Diagnosis not present

## 2015-10-21 DIAGNOSIS — M1612 Unilateral primary osteoarthritis, left hip: Secondary | ICD-10-CM | POA: Diagnosis not present

## 2015-10-21 DIAGNOSIS — M25552 Pain in left hip: Secondary | ICD-10-CM | POA: Diagnosis not present

## 2015-10-24 DIAGNOSIS — M6281 Muscle weakness (generalized): Secondary | ICD-10-CM | POA: Diagnosis not present

## 2015-10-28 DIAGNOSIS — M25552 Pain in left hip: Secondary | ICD-10-CM | POA: Diagnosis not present

## 2015-10-28 DIAGNOSIS — M7062 Trochanteric bursitis, left hip: Secondary | ICD-10-CM | POA: Diagnosis not present

## 2015-10-31 DIAGNOSIS — M25552 Pain in left hip: Secondary | ICD-10-CM | POA: Diagnosis not present

## 2015-10-31 DIAGNOSIS — M7062 Trochanteric bursitis, left hip: Secondary | ICD-10-CM | POA: Diagnosis not present

## 2015-11-04 DIAGNOSIS — M25552 Pain in left hip: Secondary | ICD-10-CM | POA: Diagnosis not present

## 2015-11-04 DIAGNOSIS — M7062 Trochanteric bursitis, left hip: Secondary | ICD-10-CM | POA: Diagnosis not present

## 2015-11-07 DIAGNOSIS — M7062 Trochanteric bursitis, left hip: Secondary | ICD-10-CM | POA: Diagnosis not present

## 2015-11-07 DIAGNOSIS — M25552 Pain in left hip: Secondary | ICD-10-CM | POA: Diagnosis not present

## 2015-11-10 DIAGNOSIS — M25552 Pain in left hip: Secondary | ICD-10-CM | POA: Diagnosis not present

## 2015-11-10 DIAGNOSIS — M7062 Trochanteric bursitis, left hip: Secondary | ICD-10-CM | POA: Diagnosis not present

## 2015-11-13 DIAGNOSIS — M7062 Trochanteric bursitis, left hip: Secondary | ICD-10-CM | POA: Diagnosis not present

## 2015-11-13 DIAGNOSIS — M25552 Pain in left hip: Secondary | ICD-10-CM | POA: Diagnosis not present

## 2015-11-18 DIAGNOSIS — M25552 Pain in left hip: Secondary | ICD-10-CM | POA: Diagnosis not present

## 2015-11-18 DIAGNOSIS — M7062 Trochanteric bursitis, left hip: Secondary | ICD-10-CM | POA: Diagnosis not present

## 2015-11-20 DIAGNOSIS — M25552 Pain in left hip: Secondary | ICD-10-CM | POA: Diagnosis not present

## 2015-11-20 DIAGNOSIS — M7062 Trochanteric bursitis, left hip: Secondary | ICD-10-CM | POA: Diagnosis not present

## 2015-11-25 DIAGNOSIS — M7062 Trochanteric bursitis, left hip: Secondary | ICD-10-CM | POA: Diagnosis not present

## 2015-11-25 DIAGNOSIS — M25552 Pain in left hip: Secondary | ICD-10-CM | POA: Diagnosis not present

## 2015-11-27 DIAGNOSIS — M7062 Trochanteric bursitis, left hip: Secondary | ICD-10-CM | POA: Diagnosis not present

## 2015-11-27 DIAGNOSIS — M25552 Pain in left hip: Secondary | ICD-10-CM | POA: Diagnosis not present

## 2015-12-02 DIAGNOSIS — M7062 Trochanteric bursitis, left hip: Secondary | ICD-10-CM | POA: Diagnosis not present

## 2015-12-02 DIAGNOSIS — M25552 Pain in left hip: Secondary | ICD-10-CM | POA: Diagnosis not present

## 2015-12-10 ENCOUNTER — Encounter: Payer: Self-pay | Admitting: Family Medicine

## 2015-12-10 ENCOUNTER — Ambulatory Visit (INDEPENDENT_AMBULATORY_CARE_PROVIDER_SITE_OTHER): Payer: Medicare Other | Admitting: Family Medicine

## 2015-12-10 ENCOUNTER — Ambulatory Visit
Admission: RE | Admit: 2015-12-10 | Discharge: 2015-12-10 | Disposition: A | Discharge status: Home / Self Care | Payer: Medicare Other | Source: Ambulatory Visit | Attending: Family Medicine | Admitting: Family Medicine

## 2015-12-10 ENCOUNTER — Telehealth: Payer: Self-pay

## 2015-12-10 VITALS — BP 120/80 | HR 64 | Temp 98.5°F | Resp 16 | Wt 155.0 lb

## 2015-12-10 DIAGNOSIS — X58XXXA Exposure to other specified factors, initial encounter: Secondary | ICD-10-CM | POA: Insufficient documentation

## 2015-12-10 DIAGNOSIS — R0789 Other chest pain: Secondary | ICD-10-CM

## 2015-12-10 DIAGNOSIS — S2231XA Fracture of one rib, right side, initial encounter for closed fracture: Secondary | ICD-10-CM | POA: Diagnosis not present

## 2015-12-10 NOTE — Progress Notes (Signed)
Patient: [name] Male    DOB: [birth year]   75 y.o.   MRN: [id number] Visit Date: 12/10/2015  Today's Provider: Lelon Huh, MD   Chief Complaint  Patient presents with  . Fall  . Wrist Pain  . Chest Pain   Subjective:    Patient fell 10 days ago down 4 steps onto concrete floor. Patient hit right side of his body. He has pain in right wrist and pain in upper right side rib area. Pain usually worse with certain movement. Has taken advil with moderate relief.    Fall  The accident occurred more than 1 week ago (10 days ago). Fall occurred: fell down 4 steps. He fell from a height of 3 to 5 ft. He landed on concrete. The point of impact was the right shoulder, right elbow, head and right knee. The pain is present in the right wrist (riight side upper rib area). The pain is at a severity of 5/10. The pain is moderate. The symptoms are aggravated by flexion, use of injured limb, rotation and movement. Pertinent negatives include no abdominal pain, bowel incontinence, fever, headaches, hearing loss, hematuria, loss of consciousness, nausea, numbness, tingling, visual change or vomiting. Treatments tried: advil. The treatment provided moderate relief.  Pain I wrist elbow and lower ribs have resolved, but starting having pain right upper and lateral chest yesterday. He is leaving for 10 day trip to [place] tomorrow and worried about developing complications from fall.     Allergies  Allergen Reactions  . Codeine Phosphate [Codeine] Other (See Comments) and Hives    Dizziness     Current Outpatient Prescriptions:  .  amLODipine-benazepril (LOTREL) 5-20 MG capsule, Take 1 capsule by mouth daily., Disp: 90 capsule, Rfl: 3 .  metoprolol succinate (TOPROL-XL) 50 MG 24 hr tablet, Take 1 tablet (50 mg total) by mouth daily., Disp: 90 tablet, Rfl: 3 .  Multiple Vitamins tablet, Take by mouth., Disp: , Rfl:   Review of Systems  Constitutional: Negative for appetite change, chills  and fever.  Respiratory: Negative for chest tightness, shortness of breath and wheezing.   Cardiovascular: Negative for chest pain and palpitations.  Gastrointestinal: Negative for abdominal pain, bowel incontinence, nausea and vomiting.  Genitourinary: Negative for hematuria.  Neurological: Negative for tingling, loss of consciousness, numbness and headaches.    Social History  Substance Use Topics  . Smoking status: Former Research scientist (life sciences)  . Smokeless tobacco: Not on file  . Alcohol use No   Objective:   BP 120/80 (BP Location: Left Arm, Patient Position: Sitting, Cuff Size: Normal)   Pulse 64   Temp 98.5 F (36.9 C) (Oral)   Resp 16   Wt 155 lb (70.3 kg)   Physical Exam   General Appearance:    Alert, cooperative, no distress  Eyes:    PERRL, conjunctiva/corneas clear, EOM's intact       Lungs:     Clear to auscultation bilaterally, respirations unlabored  Heart:    Regular rate and rhythm  Neurologic:   Awake, alert, oriented x 3. No apparent focal neurological           defect.   MS:   Mild tenderness right upper lateral chest wall. FROM of shoulder. No gross deformities, swelling, or contusions. Pain exacerbated by deep inhalation.        Assessment & Plan:     1. Other chest pain Onset about 1 week after fall and minor bruising of chest wall.  Consider course of NSAID or prednisone if xray normal.  - DG Chest 2 View; Future - DG Ribs Unilateral Right; Future       Lelon Huh, MD  [facility]

## 2015-12-10 NOTE — Patient Instructions (Signed)
Go to the [facility] on [address] for Xray  

## 2015-12-10 NOTE — Telephone Encounter (Signed)
Patient advised of results and verbally voiced understanding.  

## 2015-12-10 NOTE — Telephone Encounter (Signed)
-----   Message from Birdie Sons, MD sent at 12/10/2015  4:53 PM EDT ----- He has small fracture of right 5th rib. Should avoid any heavy or overhead lifting. Apply heat to area 2-3 times a day. No sign of pneumonia. Is OK to travel so long as he does no lifting or pulling with right arm.

## 2015-12-29 DIAGNOSIS — Z23 Encounter for immunization: Secondary | ICD-10-CM | POA: Diagnosis not present

## 2016-01-12 ENCOUNTER — Encounter: Payer: Self-pay | Admitting: Family Medicine

## 2016-01-12 ENCOUNTER — Ambulatory Visit (INDEPENDENT_AMBULATORY_CARE_PROVIDER_SITE_OTHER): Payer: Medicare Other | Admitting: Family Medicine

## 2016-01-12 VITALS — BP 124/62 | HR 68 | Temp 98.1°F | Resp 16 | Wt 154.8 lb

## 2016-01-12 DIAGNOSIS — B9789 Other viral agents as the cause of diseases classified elsewhere: Secondary | ICD-10-CM

## 2016-01-12 DIAGNOSIS — J069 Acute upper respiratory infection, unspecified: Secondary | ICD-10-CM | POA: Diagnosis not present

## 2016-01-12 NOTE — Patient Instructions (Signed)
Discussed use of Mucinex D for congestion, Delsym for cough, and Benadryl for postnasal drainage 

## 2016-01-12 NOTE — Progress Notes (Signed)
Subjective:     Patient ID: [name], male   DOB: 03-13-1941, 74 y.o.   MRN: [id number]  HPI Chief Complaint  Patient presents with  . Cough    Patient comes in office today with concerns of cough and sore thoat since 01/10/16. Patient states associated with cough he has had low grade fever of 99.5 and headache.Patient reports taking otc Delysm , Advil and Tylenol  Reports getting the flu and pneumonia shot two weeks ago.  Review of Systems     Objective:   Physical Exam  Constitutional: He appears well-developed and well-nourished.  Ears: T.M's intact without inflammation Throat: tonsils absent Neck: no cervical adenopathy Lungs: clear     Assessment:    1. Viral upper respiratory tract infection     Plan:    Discussed use of Mucinex D for congestion, Delsym for cough, and Benadryl for postnasal drainage

## 2016-01-13 ENCOUNTER — Telehealth: Payer: Self-pay | Admitting: Family Medicine

## 2016-01-13 NOTE — Telephone Encounter (Signed)
Please advise. KW 

## 2016-01-13 NOTE — Telephone Encounter (Signed)
Reports same sx as yesterday with sore throat, purulent sinus congestion, and low grade fevers (day #4). Suggested adding Aleve twice daily as he can't tolerate narcotic cough syrup. Further f/u if not improving over the course of the week.

## 2016-01-13 NOTE — Telephone Encounter (Signed)
Pt was in yesterday and seen Mikki Santee.  Mikki Santee told him to call back if he got worse.  He said all his symptoms are worse.  He uses [facility]  His call back [phone]   Thanks Con Memos

## 2016-01-14 NOTE — Telephone Encounter (Signed)
Pt called back today saying he was still feeling bad.    Still having a sore throat.  Please advise.  His call back is [phone]

## 2016-01-15 NOTE — Telephone Encounter (Signed)
Patient has been advised. KW 

## 2016-01-15 NOTE — Telephone Encounter (Signed)
Spoke with patient on the phone and he states that his wife had spoke with Dr. Rosanna Randy yesterday and he was advised that due to weather change many people are developing these type of symptoms. I advised patient per your previous message that if he was not improving he could schedule office visit, patient has declined coming back in office at this time. Patient states that he has been gargling with warm salt water and has been taking otc Tylenol for pain but he wants to know if there is anything else that he can do at home for sore throat? KW

## 2016-01-15 NOTE — Telephone Encounter (Signed)
Try to minimize post nasal drainage with Benadryl at night and Claritin or similar during the day. Call me tomorrow regarding how he is doing.

## 2016-01-16 ENCOUNTER — Telehealth: Payer: Self-pay | Admitting: Family Medicine

## 2016-01-16 ENCOUNTER — Other Ambulatory Visit: Payer: Self-pay | Admitting: Family Medicine

## 2016-01-16 DIAGNOSIS — J019 Acute sinusitis, unspecified: Secondary | ICD-10-CM

## 2016-01-16 MED ORDER — AMOXICILLIN-POT CLAVULANATE 875-125 MG PO TABS: 1.0000 | ORAL_TABLET | Freq: Two times a day (BID) | ORAL | 0 refills | Status: DC

## 2016-01-16 NOTE — Telephone Encounter (Signed)
Reports persistent purulent sinus congestion, PND, and low grade fevers. Will call in Augmentin. Continue with Mucinex D and Delsym.

## 2016-01-16 NOTE — Telephone Encounter (Signed)
Do you want me to call him? -aa

## 2016-01-16 NOTE — Telephone Encounter (Signed)
Pt request [name] return his call to discuss how he is feeling. Pt stated he isn't feeling better and would like to discuss the OTC medications he has tried. Thanks TNP

## 2016-04-23 ENCOUNTER — Other Ambulatory Visit: Payer: Self-pay | Admitting: Family Medicine

## 2016-04-23 ENCOUNTER — Telehealth: Payer: Self-pay | Admitting: Family Medicine

## 2016-04-23 DIAGNOSIS — I10 Essential (primary) hypertension: Secondary | ICD-10-CM

## 2016-04-23 MED ORDER — AMLODIPINE BESY-BENAZEPRIL HCL 5-20 MG PO CAPS: 1.0000 | ORAL_CAPSULE | Freq: Every day | ORAL | 3 refills | Status: DC

## 2016-04-23 MED ORDER — METOPROLOL SUCCINATE ER 50 MG PO TB24: 50.0000 mg | ORAL_TABLET | Freq: Every day | ORAL | 3 refills | Status: DC

## 2016-04-23 NOTE — Telephone Encounter (Signed)
Pt contacted office for refill request on the following medications: 1. metoprolol succinate (TOPROL-XL) 50 MG 24 hr tablet  2. amLODipine-benazepril (LOTREL) 5-20 MG capsule Pt is requesting 90 day supply to be sent to [facility]. Last Rx: 05/01/15 Please advise. Thanks TNP

## 2016-04-23 NOTE — Telephone Encounter (Signed)
Please review-aa 

## 2016-05-03 ENCOUNTER — Encounter: Payer: Self-pay | Admitting: Family Medicine

## 2016-05-03 ENCOUNTER — Ambulatory Visit (INDEPENDENT_AMBULATORY_CARE_PROVIDER_SITE_OTHER): Payer: Medicare Other | Admitting: Family Medicine

## 2016-05-03 VITALS — BP 168/98 | HR 75 | Temp 98.5°F | Resp 16 | Wt 156.0 lb

## 2016-05-03 DIAGNOSIS — J0100 Acute maxillary sinusitis, unspecified: Secondary | ICD-10-CM

## 2016-05-03 DIAGNOSIS — J069 Acute upper respiratory infection, unspecified: Secondary | ICD-10-CM | POA: Diagnosis not present

## 2016-05-03 MED ORDER — AMOXICILLIN-POT CLAVULANATE 875-125 MG PO TABS: 1.0000 | ORAL_TABLET | Freq: Two times a day (BID) | ORAL | 0 refills | Status: DC

## 2016-05-03 NOTE — Progress Notes (Signed)
Patient: [name] Male    DOB: [birth year]   76 y.o.   MRN: [id number] Visit Date: 05/03/2016  Today's Provider: Vernie Murders, PA   Chief Complaint  Patient presents with  . URI   Subjective:    URI   This is a new problem. Episode onset: 1 week ago. The problem has been unchanged. Associated symptoms include congestion, coughing, headaches, sinus pain and a sore throat. He has tried acetaminophen and NSAIDs for the symptoms. The treatment provided mild relief.  History of GERD with esophageal cancer and resection in 2003.  Patient Active Problem List   Diagnosis Date Noted  . 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 Hernia, inguinal, right 12/30/2013  . 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 Hypertension 08/03/2007   Past Surgical History:  Procedure Laterality Date  . APPENDECTOMY     During college  . CHOLECYSTECTOMY  1970  . HERNIA REPAIR Left 1970  . Resection of esophageal cancer  2003  . TONSILLECTOMY AND ADENOIDECTOMY     as a child  . VASECTOMY     Allergies  Allergen Reactions  . Codeine Phosphate [Codeine] Other (See Comments) and Hives    Dizziness      Previous Medications   AMLODIPINE-BENAZEPRIL (LOTREL) 5-20 MG CAPSULE    Take 1 capsule by mouth daily.   METOPROLOL SUCCINATE (TOPROL-XL) 50 MG 24 HR TABLET    Take 1 tablet (50 mg total) by mouth daily.   MULTIPLE VITAMINS TABLET    Take by mouth.    Review of Systems  Constitutional: Negative.   HENT: Positive for congestion, sinus pain and sore throat.   Respiratory: Positive for cough.   Cardiovascular: Negative.   Neurological: Positive for headaches.    Social History  Substance Use Topics  . Smoking status: Former Research scientist (life sciences)  . Smokeless tobacco: Never Used  . Alcohol use No   Objective:   BP (!) 168/98 (BP Location: Right Arm, Patient Position: Sitting, Cuff Size: Normal)   Pulse 75   Temp 98.5 F (36.9 C) (Oral)    Resp 16   Wt 156 lb (70.8 kg)   SpO2 95%   Physical Exam  Constitutional: He is oriented to person, place, and time. He appears well-developed and well-nourished.  HENT:  Head: Normocephalic.  Right Ear: External ear normal.  Left Ear: External ear normal.  Nose: Nose normal.  Injected posterior pharynx. No transillumination of left maxillary sinus. No significant tenderness.  Eyes: Conjunctivae are normal.  Neck: Neck supple.  Cardiovascular: Normal rate and regular rhythm.   Pulmonary/Chest: Effort normal and breath sounds normal. He has no wheezes. He has no rales.  Abdominal: Soft.  Lymphadenopathy:    He has no cervical adenopathy.  Neurological: He is alert and oriented to person, place, and time.  Psychiatric: He has a normal mood and affect. His behavior is normal. Thought content normal.      Assessment & Plan:     1. Upper respiratory tract infection, unspecified type Onset with cough, sore throat and slight head congestion with PND over the past week. No fevers. Using Coricidin-HBP. No decongestants. May use Mucinex-DM and treat sinusitis. BP elevation probably due to illness and cough. Should recheck BP or call report of readings in a week.  2. Subacute maxillary sinusitis No transillumination of the left maxillary sinus. No toothache symptoms or fever. Will treat with antibiotic with his history of esophageal cancer and  significant reflux issues. Continue elevation of head of bed and given antibiotic to use with Mucinex-DM for cough and congestion. Recheck prn. - amoxicillin-clavulanate (AUGMENTIN) 875-125 MG tablet; Take 1 tablet by mouth 2 (two) times daily.  Dispense: 20 tablet; Refill: 0

## 2016-06-21 DIAGNOSIS — D225 Melanocytic nevi of trunk: Secondary | ICD-10-CM | POA: Diagnosis not present

## 2016-06-21 DIAGNOSIS — D485 Neoplasm of uncertain behavior of skin: Secondary | ICD-10-CM | POA: Diagnosis not present

## 2016-06-21 DIAGNOSIS — D229 Melanocytic nevi, unspecified: Secondary | ICD-10-CM | POA: Diagnosis not present

## 2016-06-21 DIAGNOSIS — D1800 Hemangioma unspecified site: Secondary | ICD-10-CM | POA: Diagnosis not present

## 2016-06-21 DIAGNOSIS — L821 Other seborrheic keratosis: Secondary | ICD-10-CM | POA: Diagnosis not present

## 2016-06-21 DIAGNOSIS — L578 Other skin changes due to chronic exposure to nonionizing radiation: Secondary | ICD-10-CM | POA: Diagnosis not present

## 2016-06-21 DIAGNOSIS — L820 Inflamed seborrheic keratosis: Secondary | ICD-10-CM | POA: Diagnosis not present

## 2016-06-21 DIAGNOSIS — L812 Freckles: Secondary | ICD-10-CM | POA: Diagnosis not present

## 2016-07-16 ENCOUNTER — Encounter: Payer: Self-pay | Admitting: Family Medicine

## 2016-07-16 ENCOUNTER — Ambulatory Visit (INDEPENDENT_AMBULATORY_CARE_PROVIDER_SITE_OTHER): Payer: Medicare Other | Admitting: Family Medicine

## 2016-07-16 VITALS — BP 140/78 | HR 100 | Temp 97.8°F | Resp 16 | Wt 152.0 lb

## 2016-07-16 DIAGNOSIS — K529 Noninfective gastroenteritis and colitis, unspecified: Secondary | ICD-10-CM | POA: Diagnosis not present

## 2016-07-16 MED ORDER — HYOSCYAMINE SULFATE 0.125 MG SL SUBL: 0.1250 mg | SUBLINGUAL_TABLET | SUBLINGUAL | 0 refills | Status: DC | PRN

## 2016-07-16 NOTE — Patient Instructions (Signed)
Food Choices to Help Relieve Diarrhea, Adult When you have diarrhea, the foods you eat and your eating habits are very important. Choosing the right foods and drinks can help:  Relieve diarrhea.  Replace lost fluids and nutrients.  Prevent dehydration.  What general guidelines should I follow? Relieving diarrhea  Choose foods with less than 2 g or .07 oz. of fiber per serving.  Limit fats to less than 8 tsp (38 g or 1.34 oz.) a day.  Avoid the following: ? Foods and beverages sweetened with high-fructose corn syrup, honey, or sugar alcohols such as xylitol, sorbitol, and mannitol. ? Foods that contain a lot of fat or sugar. ? Fried, greasy, or spicy foods. ? High-fiber grains, breads, and cereals. ? Raw fruits and vegetables.  Eat foods that are rich in probiotics. These foods include dairy products such as yogurt and fermented milk products. They help increase healthy bacteria in the stomach and intestines (gastrointestinal tract, or GI tract).  If you have lactose intolerance, avoid dairy products. These may make your diarrhea worse.  Take medicine to help stop diarrhea (antidiarrheal medicine) only as told by your health care provider. Replacing nutrients  Eat small meals or snacks every 3-4 hours.  Eat bland foods, such as white rice, toast, or baked potato, until your diarrhea starts to get better. Gradually reintroduce nutrient-rich foods as tolerated or as told by your health care provider. This includes: ? Well-cooked protein foods. ? Peeled, seeded, and soft-cooked fruits and vegetables. ? Low-fat dairy products.  Take vitamin and mineral supplements as told by your health care provider. Preventing dehydration   Start by sipping water or a special solution to prevent dehydration (oral rehydration solution, ORS). Urine that is clear or pale yellow means that you are getting enough fluid.  Try to drink at least 8-10 cups of fluid each day to help replace lost  fluids.  You may add other liquids in addition to water, such as clear juice or decaffeinated sports drinks, as tolerated or as told by your health care provider.  Avoid drinks with caffeine, such as coffee, tea, or soft drinks.  Avoid alcohol. What foods are recommended? The items listed may not be a complete list. Talk with your health care provider about what dietary choices are best for you. Grains White rice. White, French, or pita breads (fresh or toasted), including plain rolls, buns, or bagels. White pasta. Saltine, soda, or graham crackers. Pretzels. Low-fiber cereal. Cooked cereals made with water (such as cornmeal, farina, or cream cereals). Plain muffins. Matzo. Melba toast. Zwieback. Vegetables Potatoes (without the skin). Most well-cooked and canned vegetables without skins or seeds. Tender lettuce. Fruits Apple sauce. Fruits canned in juice. Cooked apricots, cherries, grapefruit, peaches, pears, or plums. Fresh bananas and cantaloupe. Meats and other protein foods Baked or boiled chicken. Eggs. Tofu. Fish. Seafood. Smooth nut butters. Ground or well-cooked tender beef, ham, veal, lamb, pork, or poultry. Dairy Plain yogurt, kefir, and unsweetened liquid yogurt. Lactose-free milk, buttermilk, skim milk, or soy milk. Low-fat or nonfat hard cheese. Beverages Water. Low-calorie sports drinks. Fruit juices without pulp. Strained tomato and vegetable juices. Decaffeinated teas. Sugar-free beverages not sweetened with sugar alcohols. Oral rehydration solutions, if approved by your health care provider. Seasoning and other foods Bouillon, broth, or soups made from recommended foods. What foods are not recommended? The items listed may not be a complete list. Talk with your health care provider about what dietary choices are best for you. Grains Whole grain, whole wheat,   bran, or rye breads, rolls, pastas, and crackers. Wild or brown rice. Whole grain or bran cereals. Barley. Oats and  oatmeal. Corn tortillas or taco shells. Granola. Popcorn. Vegetables Raw vegetables. Fried vegetables. Cabbage, broccoli, Brussels sprouts, artichokes, baked beans, beet greens, corn, kale, legumes, peas, sweet potatoes, and yams. Potato skins. Cooked spinach and cabbage. Fruits Dried fruit, including raisins and dates. Raw fruits. Stewed or dried prunes. Canned fruits with syrup. Meat and other protein foods Fried or fatty meats. Deli meats. Chunky nut butters. Nuts and seeds. Beans and lentils. Bacon. Hot dogs. Sausage. Dairy High-fat cheeses. Whole milk, chocolate milk, and beverages made with milk, such as milk shakes. Half-and-half. Cream. sour cream. Ice cream. Beverages Caffeinated beverages (such as coffee, tea, soda, or energy drinks). Alcoholic beverages. Fruit juices with pulp. Prune juice. Soft drinks sweetened with high-fructose corn syrup or sugar alcohols. High-calorie sports drinks. Fats and oils Butter. Cream sauces. Margarine. Salad oils. Plain salad dressings. Olives. Avocados. Mayonnaise. Sweets and desserts Sweet rolls, doughnuts, and sweet breads. Sugar-free desserts sweetened with sugar alcohols such as xylitol and sorbitol. Seasoning and other foods Honey. Hot sauce. Chili powder. Gravy. Cream-based or milk-based soups. Pancakes and waffles. Summary  When you have diarrhea, the foods you eat and your eating habits are very important.  Make sure you get at least 8-10 cups of fluid each day, or enough to keep your urine clear or pale yellow.  Eat bland foods and gradually reintroduce healthy, nutrient-rich foods as tolerated, or as told by your health care provider.  Avoid high-fiber, fried, greasy, or spicy foods. This information is not intended to replace advice given to you by your health care provider. Make sure you discuss any questions you have with your health care provider. Document Released: 06/05/2003 Document Revised: 03/12/2016 Document Reviewed:  03/12/2016 Elsevier Interactive Patient Education  2017 Elsevier Inc.  

## 2016-07-16 NOTE — Progress Notes (Signed)
Patient: [name] Male    DOB: 11-09-1940   76 y.o.   MRN: [id number] Visit Date: 07/16/2016  Today's Provider: Vernie Murders, PA   Chief Complaint  Patient presents with  . Abdominal Pain   Subjective:    Abdominal Pain  This is a new problem. The current episode started 1 to 4 weeks ago (x 1 week). The onset quality is sudden. The problem occurs constantly. The pain is located in the generalized abdominal region. The pain is at a severity of 6/10. The pain is moderate. The quality of the pain is aching and cramping. The abdominal pain does not radiate. Associated symptoms include anorexia, belching, diarrhea (at onset; has resolved now) and nausea. Pertinent negatives include no constipation, dysuria, fever, flatus, frequency, hematochezia, hematuria, melena or vomiting. Nothing aggravates the pain. Treatments tried: Pepto Bismol. The treatment provided moderate relief.       Allergies  Allergen Reactions  . Codeine Phosphate [Codeine] Other (See Comments) and Hives    Dizziness     Current Outpatient Prescriptions:  .  amLODipine-benazepril (LOTREL) 5-20 MG capsule, Take 1 capsule by mouth daily., Disp: 90 capsule, Rfl: 3 .  metoprolol succinate (TOPROL-XL) 50 MG 24 hr tablet, Take 1 tablet (50 mg total) by mouth daily., Disp: 90 tablet, Rfl: 3 .  Multiple Vitamins tablet, Take by mouth., Disp: , Rfl:   Review of Systems  Constitutional: Negative for fever.  Gastrointestinal: Positive for abdominal pain, anorexia, diarrhea (at onset; has resolved now) and nausea. Negative for constipation, flatus, hematochezia, melena and vomiting.  Genitourinary: Negative for dysuria, frequency and hematuria.    Social History  Substance Use Topics  . Smoking status: Former Research scientist (life sciences)  . Smokeless tobacco: Never Used  . Alcohol use No   Objective:   BP 140/78 (BP Location: Right Arm, Patient Position: Sitting, Cuff Size: Normal)   Pulse 100   Temp 97.8 F (36.6 C)  (Oral)   Resp 16   Wt 152 lb (68.9 kg)  Vitals:   07/16/16 1027  BP: 140/78  Pulse: 100  Resp: 16  Temp: 97.8 F (36.6 C)  TempSrc: Oral  Weight: 152 lb (68.9 kg)     Physical Exam  Constitutional: He is oriented to person, place, and time. He appears well-developed and well-nourished. No distress.  HENT:  Head: Normocephalic and atraumatic.  Right Ear: Hearing normal.  Left Ear: Hearing normal.  Nose: Nose normal.  Eyes: Conjunctivae and lids are normal. Right eye exhibits no discharge. Left eye exhibits no discharge. No scleral icterus.  Cardiovascular: Normal rate and regular rhythm.   Pulmonary/Chest: Effort normal and breath sounds normal. No respiratory distress.  Abdominal: Soft. He exhibits no distension and no mass. There is tenderness. There is no rebound and no guarding.  Quite bowel sounds.  Musculoskeletal: Normal range of motion.  Neurological: He is alert and oriented to person, place, and time.  Skin: Skin is intact. No lesion and no rash noted.  Psychiatric: He has a normal mood and affect. His speech is normal and behavior is normal. Thought content normal.       Assessment & Plan:     1. Gastroenteritis Onset with diarrhea, nausea and generalized abdominal discomfort 5 days ago. Had improvement 2 days ago but recurrence of nausea and some discomfort the past 2 days. No vomiting, hematemesis, or hematochezia. Some dark stools with frequent use of Pepto-Bismol the past couple day. Increase fluid intake and get CBC with  CMP. Given Levsin-SL for nausea and cramps. Recheck pending lab reports. Home to rest with bland diet. - CBC with Differential/Platelet - Comprehensive metabolic panel - hyoscyamine (LEVSIN SL) 0.125 MG SL tablet; Place 1 tablet (0.125 mg total) under the tongue every 4 (four) hours as needed.  Dispense: 30 tablet; Refill: 0     Patient seen and examined by Vernie Murders, PA, and note scribed by [name], CMA.  Vernie Murders, PA   [facility]

## 2016-07-17 LAB — COMPREHENSIVE METABOLIC PANEL
A/G RATIO: 1.7 (ref 1.2–2.2)
ALBUMIN: 4.5 g/dL (ref 3.5–4.8)
ALK PHOS: 91 IU/L (ref 39–117)
ALT: 22 IU/L (ref 0–44)
AST: 22 IU/L (ref 0–40)
BILIRUBIN TOTAL: 3.9 mg/dL — AB (ref 0.0–1.2)
BUN / CREAT RATIO: 17 (ref 10–24)
BUN: 16 mg/dL (ref 8–27)
CHLORIDE: 101 mmol/L (ref 96–106)
CO2: 21 mmol/L (ref 18–29)
Calcium: 9.4 mg/dL (ref 8.6–10.2)
Creatinine, Ser: 0.95 mg/dL (ref 0.76–1.27)
GFR calc Af Amer: 91 mL/min/{1.73_m2} (ref >59)
GFR calc non Af Amer: 79 mL/min/{1.73_m2} (ref >59)
GLOBULIN, TOTAL: 2.7 g/dL (ref 1.5–4.5)
Glucose: 131 mg/dL — ABNORMAL HIGH (ref 65–99)
Potassium: 3.5 mmol/L (ref 3.5–5.2)
SODIUM: 142 mmol/L (ref 134–144)
Total Protein: 7.2 g/dL (ref 6.0–8.5)

## 2016-07-17 LAB — CBC WITH DIFFERENTIAL/PLATELET
BASOS: 0 %
Basophils Absolute: 0 10*3/uL (ref 0.0–0.2)
EOS (ABSOLUTE): 0.1 10*3/uL (ref 0.0–0.4)
EOS: 1 %
HEMATOCRIT: 48.1 % (ref 37.5–51.0)
HEMOGLOBIN: 17.6 g/dL (ref 13.0–17.7)
Immature Grans (Abs): 0 10*3/uL (ref 0.0–0.1)
Immature Granulocytes: 0 %
LYMPHS ABS: 1 10*3/uL (ref 0.7–3.1)
Lymphs: 10 %
MCH: 32.4 pg (ref 26.6–33.0)
MCHC: 36.6 g/dL — AB (ref 31.5–35.7)
MCV: 89 fL (ref 79–97)
MONOS ABS: 0.8 10*3/uL (ref 0.1–0.9)
Monocytes: 9 %
Neutrophils Absolute: 7.8 10*3/uL — ABNORMAL HIGH (ref 1.4–7.0)
Neutrophils: 80 %
Platelets: 228 10*3/uL (ref 150–379)
RBC: 5.43 x10E6/uL (ref 4.14–5.80)
RDW: 14.3 % (ref 12.3–15.4)
WBC: 9.8 10*3/uL (ref 3.4–10.8)

## 2016-07-26 ENCOUNTER — Telehealth: Payer: Self-pay | Admitting: Family Medicine

## 2016-07-26 DIAGNOSIS — R110 Nausea: Secondary | ICD-10-CM

## 2016-07-26 DIAGNOSIS — R1013 Epigastric pain: Secondary | ICD-10-CM

## 2016-07-26 MED ORDER — ONDANSETRON 4 MG PO TBDP: 4.0000 mg | ORAL_TABLET | Freq: Three times a day (TID) | ORAL | 0 refills | Status: DC | PRN

## 2016-07-26 NOTE — Telephone Encounter (Signed)
Continues to have recurrent nausea throughout the day. No further diarrhea and no fever. Some epigastric pain today. No BM yesterday and took a Dulcolax last night. The Levsin-SL not helping nausea much but slight help with stomach cramps. No vomiting or hematemesis. Will give Zofran-ODT prn nausea, order amylase, lipase, CMP and get CT scan of abdomen with and without contrast. May need to go to [facility] ER if worsening epigastric pain with history of esophageal cancer.

## 2016-07-27 ENCOUNTER — Telehealth: Payer: Self-pay | Admitting: Family Medicine

## 2016-07-27 ENCOUNTER — Ambulatory Visit
Admission: RE | Admit: 2016-07-27 | Discharge: 2016-07-27 | Disposition: A | Discharge status: Home / Self Care | Payer: Medicare Other | Source: Ambulatory Visit | Attending: Family Medicine | Admitting: Family Medicine

## 2016-07-27 ENCOUNTER — Telehealth: Payer: Self-pay

## 2016-07-27 ENCOUNTER — Other Ambulatory Visit: Payer: Self-pay | Admitting: Family Medicine

## 2016-07-27 DIAGNOSIS — K5730 Diverticulosis of large intestine without perforation or abscess without bleeding: Secondary | ICD-10-CM | POA: Diagnosis not present

## 2016-07-27 DIAGNOSIS — R1013 Epigastric pain: Secondary | ICD-10-CM | POA: Diagnosis not present

## 2016-07-27 DIAGNOSIS — Z9889 Other specified postprocedural states: Secondary | ICD-10-CM | POA: Diagnosis not present

## 2016-07-27 DIAGNOSIS — K7689 Other specified diseases of liver: Secondary | ICD-10-CM | POA: Insufficient documentation

## 2016-07-27 DIAGNOSIS — N400 Enlarged prostate without lower urinary tract symptoms: Secondary | ICD-10-CM | POA: Diagnosis not present

## 2016-07-27 DIAGNOSIS — R110 Nausea: Secondary | ICD-10-CM | POA: Insufficient documentation

## 2016-07-27 HISTORY — DX: Malignant (primary) neoplasm, unspecified: C80.1

## 2016-07-27 MED ORDER — CIPROFLOXACIN HCL 500 MG PO TABS: 500.0000 mg | ORAL_TABLET | Freq: Two times a day (BID) | ORAL | 0 refills | Status: DC

## 2016-07-27 MED ORDER — IOPAMIDOL (ISOVUE-300) INJECTION 61%
100.0000 mL | Freq: Once | INTRAVENOUS | Status: AC | PRN
Start: 2016-07-27 — End: 2016-07-27
  Administered 2016-07-27: 100 mL via INTRAVENOUS

## 2016-07-27 NOTE — Telephone Encounter (Signed)
X-ray asked that CT scan be changed to abdominal and pelvic scan with contrast instead of pancreas scan to evaluate persistent nausea with epigastric pain. Changed to stat order and ordered follow up labs.

## 2016-07-27 NOTE — Telephone Encounter (Signed)
-----   Message from Margo Common, [place] sent at 07/27/2016 11:26 AM EDT ----- No sign of acute changes in pancreas, stomach, kidneys, bladder, prostate or liver. Unchanged cysts in liver. Some diverticulosis in colon but no acute diverticulitis (infection/inflammation). Proceed with additional blood tests and use Zofran prn nausea. Recheck with surgeon that worked on his esophagus as planned. Restrict acidic foods and make meals soft with plenty of liquids. No greasy/fried meals or heavy spice. Awaiting report of extra labs today.

## 2016-07-27 NOTE — Telephone Encounter (Signed)
Patient called back to his doctor at [facility] and arranged to be seen 07-29-16. He told his doctor he had some diverticulitis show up on his CT scan and was told to be sure he was on an antibiotic until checked at [facility]. CT scan actually showed diverticulosis and no diverticulitis. Very anxious about this and requested we start the Cipro for 3 days. Still have not received report of labs today and the Cipro may help if any infectious process causing nausea and recent diarrhea. No further diarrhea or pain at home today. Encouraged to proceed with bland diet with extra fluids and may use Zofran prn nausea (helped last night). Follow up with doctor at [facility] Thursday and take copy of CT scan with him.

## 2016-07-27 NOTE — Telephone Encounter (Signed)
Pt is requesting a call back from [place] today if possible.  Pt states it is personal.  [phone]

## 2016-07-27 NOTE — Telephone Encounter (Signed)
Patient advised.

## 2016-07-28 LAB — COMPREHENSIVE METABOLIC PANEL
ALK PHOS: 86 IU/L (ref 39–117)
ALT: 19 IU/L (ref 0–44)
AST: 16 IU/L (ref 0–40)
Albumin/Globulin Ratio: 1.7 (ref 1.2–2.2)
Albumin: 4.4 g/dL (ref 3.5–4.8)
BUN/Creatinine Ratio: 12 (ref 10–24)
BUN: 12 mg/dL (ref 8–27)
Bilirubin Total: 3.1 mg/dL — ABNORMAL HIGH (ref 0.0–1.2)
CO2: 21 mmol/L (ref 18–29)
CREATININE: 0.98 mg/dL (ref 0.76–1.27)
Calcium: 8.9 mg/dL (ref 8.6–10.2)
Chloride: 96 mmol/L (ref 96–106)
GFR calc Af Amer: 87 mL/min/{1.73_m2} (ref >59)
GFR calc non Af Amer: 76 mL/min/{1.73_m2} (ref >59)
GLUCOSE: 127 mg/dL — AB (ref 65–99)
Globulin, Total: 2.6 g/dL (ref 1.5–4.5)
Potassium: 3.4 mmol/L — ABNORMAL LOW (ref 3.5–5.2)
Sodium: 135 mmol/L (ref 134–144)
Total Protein: 7 g/dL (ref 6.0–8.5)

## 2016-07-28 LAB — AMYLASE: Amylase: 72 U/L (ref 31–124)

## 2016-07-28 LAB — CBC WITH DIFFERENTIAL/PLATELET
Basophils Absolute: 0 10*3/uL (ref 0.0–0.2)
Basos: 0 %
EOS (ABSOLUTE): 0 10*3/uL (ref 0.0–0.4)
EOS: 0 %
HEMOGLOBIN: 17.6 g/dL (ref 13.0–17.7)
Hematocrit: 48.1 % (ref 37.5–51.0)
IMMATURE GRANS (ABS): 0 10*3/uL (ref 0.0–0.1)
IMMATURE GRANULOCYTES: 0 %
LYMPHS: 7 %
Lymphocytes Absolute: 0.7 10*3/uL (ref 0.7–3.1)
MCH: 32.2 pg (ref 26.6–33.0)
MCHC: 36.6 g/dL — AB (ref 31.5–35.7)
MCV: 88 fL (ref 79–97)
MONOS ABS: 0.7 10*3/uL (ref 0.1–0.9)
Monocytes: 8 %
NEUTROS PCT: 85 %
Neutrophils Absolute: 7.8 10*3/uL — ABNORMAL HIGH (ref 1.4–7.0)
Platelets: 226 10*3/uL (ref 150–379)
RBC: 5.46 x10E6/uL (ref 4.14–5.80)
RDW: 13.5 % (ref 12.3–15.4)
WBC: 9.3 10*3/uL (ref 3.4–10.8)

## 2016-07-28 LAB — LIPASE: Lipase: 25 U/L (ref 13–78)

## 2016-07-29 DIAGNOSIS — K222 Esophageal obstruction: Secondary | ICD-10-CM | POA: Diagnosis not present

## 2016-08-20 ENCOUNTER — Other Ambulatory Visit: Payer: Self-pay | Admitting: Family Medicine

## 2016-08-20 DIAGNOSIS — R1013 Epigastric pain: Secondary | ICD-10-CM

## 2016-08-20 NOTE — Progress Notes (Signed)
Continues to have frequent nausea and epigastric discomfort over the past 6-8 weeks. Was seen by Dr. Lerry Paterson (CT surgeon) last week and was told "everything looks good" (follow up of esophageal cancer post esophagogastrectomy in 2003). Has a family history of PUD in mother and father and concerned he may be having one himself with this persistent nausea and epigastric discomfort. States he feels like he did when he had Hepatitis and Salmonella may years ago. Denies vomiting, constipation, hematochezia, melena, diarrhea or fever. Will check CBC, CMP and H. Pylori Breath Test. Recommend he use the Zofran-ODT prn nausea and Zantac 150 mg BID. Will try to contact Dr. Elenor Quinones or refer to a gastroenterologist pending reports.

## 2016-08-23 LAB — COMPREHENSIVE METABOLIC PANEL
ALBUMIN: 4.3 g/dL (ref 3.5–4.8)
ALT: 17 IU/L (ref 0–44)
AST: 15 IU/L (ref 0–40)
Albumin/Globulin Ratio: 2 (ref 1.2–2.2)
Alkaline Phosphatase: 80 IU/L (ref 39–117)
BUN/Creatinine Ratio: 19 (ref 10–24)
BUN: 20 mg/dL (ref 8–27)
Bilirubin Total: 2.3 mg/dL — ABNORMAL HIGH (ref 0.0–1.2)
CO2: 22 mmol/L (ref 18–29)
CREATININE: 1.04 mg/dL (ref 0.76–1.27)
Calcium: 9.1 mg/dL (ref 8.6–10.2)
Chloride: 102 mmol/L (ref 96–106)
GFR, EST AFRICAN AMERICAN: 81 mL/min/{1.73_m2} (ref >59)
GFR, EST NON AFRICAN AMERICAN: 70 mL/min/{1.73_m2} (ref >59)
GLOBULIN, TOTAL: 2.1 g/dL (ref 1.5–4.5)
Glucose: 107 mg/dL — ABNORMAL HIGH (ref 65–99)
Potassium: 3.9 mmol/L (ref 3.5–5.2)
SODIUM: 138 mmol/L (ref 134–144)
TOTAL PROTEIN: 6.4 g/dL (ref 6.0–8.5)

## 2016-08-23 LAB — CBC WITH DIFFERENTIAL/PLATELET
BASOS: 1 %
Basophils Absolute: 0.1 10*3/uL (ref 0.0–0.2)
EOS (ABSOLUTE): 0.2 10*3/uL (ref 0.0–0.4)
EOS: 2 %
HEMATOCRIT: 46.3 % (ref 37.5–51.0)
Hemoglobin: 16 g/dL (ref 13.0–17.7)
IMMATURE GRANULOCYTES: 0 %
Immature Grans (Abs): 0 10*3/uL (ref 0.0–0.1)
Lymphocytes Absolute: 1.4 10*3/uL (ref 0.7–3.1)
Lymphs: 14 %
MCH: 32.1 pg (ref 26.6–33.0)
MCHC: 34.6 g/dL (ref 31.5–35.7)
MCV: 93 fL (ref 79–97)
MONOS ABS: 0.7 10*3/uL (ref 0.1–0.9)
Monocytes: 7 %
NEUTROS ABS: 7.5 10*3/uL — AB (ref 1.4–7.0)
NEUTROS PCT: 76 %
Platelets: 220 10*3/uL (ref 150–379)
RBC: 4.98 x10E6/uL (ref 4.14–5.80)
RDW: 14 % (ref 12.3–15.4)
WBC: 9.9 10*3/uL (ref 3.4–10.8)

## 2016-08-23 LAB — H. PYLORI BREATH TEST: H pylori Breath Test: NEGATIVE

## 2016-09-28 ENCOUNTER — Encounter: Payer: Self-pay | Admitting: Family Medicine

## 2016-09-28 ENCOUNTER — Other Ambulatory Visit: Payer: Self-pay

## 2016-09-28 ENCOUNTER — Ambulatory Visit (INDEPENDENT_AMBULATORY_CARE_PROVIDER_SITE_OTHER): Payer: Medicare Other | Admitting: Family Medicine

## 2016-09-28 VITALS — BP 146/78 | HR 74 | Temp 98.1°F | Ht 71.0 in | Wt 151.4 lb

## 2016-09-28 DIAGNOSIS — E162 Hypoglycemia, unspecified: Secondary | ICD-10-CM

## 2016-09-28 DIAGNOSIS — C269 Malignant neoplasm of ill-defined sites within the digestive system: Secondary | ICD-10-CM

## 2016-09-28 NOTE — Progress Notes (Signed)
Patient: [name] Male    DOB: 17-Mar-1941   76 y.o.   MRN: [id number] Visit Date: 09/28/2016  Today's Provider: Vernie Murders, PA   Chief Complaint  Patient presents with  . Follow-up   Subjective:    HPI Patient is here today to follow up on labs. Last OV and labs were on 07/16/2016. Lab results showed improvement. Glucose level was 107. Patient states he has been taking his blood sugar at home and the readings have been fluctuating between 70-110. He states when he feels "crappy" he will check blood sugar and the readings will be around 70. Patient spoke with [name] regarding lab results and he advised patient to follow up for more labs regarding blood sugar.   Patient Active Problem List   Diagnosis Date Noted  . 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 Hernia, inguinal, right 12/30/2013  . 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 Hypertension 08/03/2007   Past Surgical History:  Procedure Laterality Date  . APPENDECTOMY     During college  . CHOLECYSTECTOMY  1970  . HERNIA REPAIR Left 1970  . Resection of esophageal cancer  2003  . TONSILLECTOMY AND ADENOIDECTOMY     as a child  . VASECTOMY     Family History  Problem Relation Age of Onset  . Cancer Sister        Breast cancer   Allergies  Allergen Reactions  . Codeine Phosphate [Codeine] Other (See Comments) and Hives    Dizziness     Previous Medications   AMLODIPINE-BENAZEPRIL (LOTREL) 5-20 MG CAPSULE    Take 1 capsule by mouth daily.   HYOSCYAMINE (LEVSIN SL) 0.125 MG SL TABLET    Place 1 tablet (0.125 mg total) under the tongue every 4 (four) hours as needed.   METOPROLOL SUCCINATE (TOPROL-XL) 50 MG 24 HR TABLET    Take 1 tablet (50 mg total) by mouth daily.   MULTIPLE VITAMINS TABLET    Take by mouth.   ONDANSETRON (ZOFRAN-ODT) 4 MG DISINTEGRATING TABLET    Take 1 tablet (4 mg total) by mouth every 8 (eight) hours as needed for nausea or  vomiting.    Review of Systems  Constitutional: Negative.   Respiratory: Negative.   Cardiovascular: Negative.     Social History  Substance Use Topics  . Smoking status: Former Smoker    Packs/day: 2.00    Years: 10.00    Quit date: 03/28/1972  . Smokeless tobacco: Never Used  . Alcohol use No   Objective:   BP (!) 146/78 (BP Location: Right Arm, Patient Position: Sitting, Cuff Size: Normal)   Pulse 74   Temp 98.1 F (36.7 C) (Oral)   Wt 151 lb 6.4 oz (68.7 kg)   SpO2 98%   Physical Exam  Constitutional: He is oriented to person, place, and time. He appears well-developed and well-nourished. No distress.  HENT:  Head: Normocephalic and atraumatic.  Right Ear: Hearing normal.  Left Ear: Hearing normal.  Nose: Nose normal.  Eyes: Conjunctivae and lids are normal. Right eye exhibits no discharge. Left eye exhibits no discharge. No scleral icterus.  Neck: Neck supple. No thyromegaly present.  Cardiovascular: Normal rate and regular rhythm.   Pulmonary/Chest: Effort normal and breath sounds normal. No respiratory distress.  Abdominal: Soft. Bowel sounds are normal.  Musculoskeletal: Normal range of motion.  Neurological: He is alert and oriented to person, place, and time.  Skin: Skin is intact.  No lesion and no rash noted.  Psychiatric: He has a normal mood and affect. His speech is normal and behavior is normal. Thought content normal.      Assessment & Plan:     1. Hypoglycemia Still some occasions of blood sugar drops. Feeling better than he did a couple months ago. No further diarrhea. Still has some weakness, sweats and when this occurs blood sugar is usually 70 or less. Will feel better after he eats something and retests to find sugar is above 100. Suspect this is related to the esophageal cancer surgery with partial gastrectomy causing some pancreatic malfunction in insulin production. CT scan on 07-27-16 did not show any recurrence of pathology or pancreatic lesion.  Given hypoglycemic diet and dumping syndrome diet. Check insulin, proinsulin and C-Peptide levels. - Insulin and C-Peptide - Proinsulin  2. 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 Elenor Quinones (CVT surgeon) at the [facility] on a prn basis now. No sign of recurrence on exam of 07-29-16.

## 2016-09-28 NOTE — Patient Instructions (Signed)
Hypoglycemia  Hypoglycemia occurs when the level of sugar (glucose) in the blood is too low. Glucose is a type of sugar that provides the body's main source of energy. Certain hormones (insulin and glucagon) control the level of glucose in the blood. Insulin lowers blood glucose, and glucagon increases blood glucose. Hypoglycemia can result from having too much insulin in the bloodstream, or from not eating enough food that contains glucose.  Hypoglycemia can happen in people who do or do not have diabetes. It can develop quickly, and it can be a medical emergency.  What are the causes?  Hypoglycemia occurs most often in people who have diabetes. If you have diabetes, hypoglycemia may be caused by:   Diabetes medicine.   Not eating enough, or not eating often enough.   Increased physical activity.   Drinking alcohol, especially when you have not eaten recently.    If you do not have diabetes, hypoglycemia may be caused by:   A tumor in the pancreas. The pancreas is the organ that makes insulin.   Not eating enough, or not eating for long periods at a time (fasting).   Severe infection or illness that affects the liver, heart, or kidneys.   Certain medicines.    You may also have reactive hypoglycemia. This condition causes hypoglycemia within 4 hours of eating a meal. This may occur after having stomach surgery. Sometimes, the cause of reactive hypoglycemia is not known.  What increases the risk?  Hypoglycemia is more likely to develop in:   People who have diabetes and take medicines to lower blood glucose.   People who abuse alcohol.   People who have a severe illness.    What are the signs or symptoms?  Hypoglycemia may not cause any symptoms. If you have symptoms, they may include:   Hunger.   Anxiety.   Sweating and feeling clammy.   Confusion.   Dizziness or feeling light-headed.   Sleepiness.   Nausea.   Increased heart rate.   Headache.   Blurry  vision.   Seizure.   Nightmares.   Tingling or numbness around the mouth, lips, or tongue.   A change in speech.   Decreased ability to concentrate.   A change in coordination.   Restless sleep.   Tremors or shakes.   Fainting.   Irritability.    How is this diagnosed?  Hypoglycemia is diagnosed with a blood test to measure your blood glucose level. This blood test is done while you are having symptoms. Your health care provider may also do a physical exam and review your medical history.  If you do not have diabetes, other tests may be done to find the cause of your hypoglycemia.  How is this treated?  This condition can often be treated by immediately eating or drinking something that contains glucose, such as:   3-4 sugar tablets (glucose pills).   Glucose gel, 15-gram tube.   Fruit juice, 4 oz (120 mL).   Regular soda (not diet soda), 4 oz (120 mL).   Low-fat milk, 4 oz (120 mL).   Several pieces of hard candy.   Sugar or honey, 1 Tbsp.    Treating Hypoglycemia If You Have Diabetes    If you are alert and able to swallow safely, follow the 15:15 rule:   Take 15 grams of a rapid-acting carbohydrate. Rapid-acting options include:  ? 1 tube of glucose gel.  ? 3 glucose pills.  ? 6-8 pieces of hard candy.  ?   4 oz (120 mL) of fruit juice.  ? 4 oz (120 ml) of regular (not diet) soda.   Check your blood glucose 15 minutes after you take the carbohydrate.   If the repeat blood glucose level is still at or below 70 mg/dL (3.9 mmol/L), take 15 grams of a carbohydrate again.   If your blood glucose level does not increase above 70 mg/dL (3.9 mmol/L) after 3 tries, seek emergency medical care.   After your blood glucose level returns to normal, eat a meal or a snack within 1 hour.    Treating Severe Hypoglycemia  Severe hypoglycemia is when your blood glucose level is at or below 54 mg/dL (3 mmol/L). Severe hypoglycemia is an emergency. Do not wait to see if the symptoms will go away. Get medical help  right away. Call your local emergency services (911 in the [place] Do not drive yourself to the hospital.  If you have severe hypoglycemia and you cannot eat or drink, you may need an injection of glucagon. A family member or close friend should learn how to check your blood glucose and how to give you a glucagon injection. Ask your health care provider if you need to have an emergency glucagon injection kit available.  Severe hypoglycemia may need to be treated in a hospital. The treatment may include getting glucose through an IV tube. You may also need treatment for the cause of your hypoglycemia.  Follow these instructions at home:  General instructions   Avoid any diets that cause you to not eat enough food. Talk with your health care provider before you start any new diet.   Take over-the-counter and prescription medicines only as told by your health care provider.   Limit alcohol intake to no more than 1 drink per day for nonpregnant women and 2 drinks per day for men. One drink equals 12 oz of beer, 5 oz of wine, or 1 oz of hard liquor.   Keep all follow-up visits as told by your health care provider. This is important.  If You Have Diabetes:     Make sure you know the symptoms of hypoglycemia.   Always have a rapid-acting carbohydrate snack with you to treat low blood sugar.   Follow your diabetes management plan, as told by your health care provider. Make sure you:  ? Take your medicines as directed.  ? Follow your exercise plan.  ? Follow your meal plan. Eat on time, and do not skip meals.  ? Check your blood glucose as often as directed. Make sure to check your blood glucose before and after exercise. If you exercise longer or in a different way than usual, check your blood glucose more often.  ? Follow your sick day plan whenever you cannot eat or drink normally. Make this plan in advance with your health care provider.   Share your diabetes management plan with people in your workplace, school,  and household.   Check your urine for ketones when you are ill and as told by your health care provider.   Carry a medical alert card or wear medical alert jewelry.  If You Have Reactive Hypoglycemia or Low Blood Sugar From Other Causes:   Monitor your blood glucose as told by your health care provider.   Follow instructions from your health care provider about eating or drinking restrictions.  Contact a health care provider if:   You have problems keeping your blood glucose in your target range.   You have   frequent episodes of hypoglycemia.  Get help right away if:   You continue to have hypoglycemia symptoms after eating or drinking something containing glucose.   Your blood glucose is at or below 54 mg/dL (3 mmol/L).   You have a seizure.   You faint.  These symptoms may represent a serious problem that is an emergency. Do not wait to see if the symptoms will go away. Get medical help right away. Call your local emergency services (911 in the [place] Do not drive yourself to the hospital.  This information is not intended to replace advice given to you by your health care provider. Make sure you discuss any questions you have with your health care provider.  Document Released: 03/15/2005 Document Revised: 08/27/2015 Document Reviewed: 04/18/2015  Elsevier Interactive Patient Education  2018 [employer] Inc[employer]

## 2016-10-01 DIAGNOSIS — E162 Hypoglycemia, unspecified: Secondary | ICD-10-CM | POA: Diagnosis not present

## 2016-10-04 LAB — PROINSULIN: PROINSULIN: 3.4 pmol/L (ref 0.0–10.0)

## 2016-10-04 LAB — INSULIN AND C-PEPTIDE, SERUM
C PEPTIDE: 3.2 ng/mL (ref 1.1–4.4)
INSULIN: 13.3 u[IU]/mL (ref 2.6–24.9)

## 2016-10-18 ENCOUNTER — Other Ambulatory Visit: Payer: Self-pay

## 2016-10-26 ENCOUNTER — Encounter: Payer: Self-pay | Admitting: Family Medicine

## 2016-10-26 ENCOUNTER — Ambulatory Visit (INDEPENDENT_AMBULATORY_CARE_PROVIDER_SITE_OTHER): Payer: Medicare Other | Admitting: Family Medicine

## 2016-10-26 VITALS — BP 138/68 | HR 65 | Temp 98.1°F | Wt 150.4 lb

## 2016-10-26 DIAGNOSIS — R0609 Other forms of dyspnea: Secondary | ICD-10-CM

## 2016-10-26 DIAGNOSIS — R42 Dizziness and giddiness: Secondary | ICD-10-CM

## 2016-10-26 DIAGNOSIS — Z8501 Personal history of malignant neoplasm of esophagus: Secondary | ICD-10-CM

## 2016-10-26 LAB — EKG 12-LEAD: EKG 12 lead: NEGATIVE

## 2016-10-26 NOTE — Progress Notes (Signed)
Patient: [name] Male    DOB: [birth year]   76 y.o.   MRN: [id number] Visit Date: 10/26/2016  Today's Provider: Vernie Murders, PA   Chief Complaint  Patient presents with  . Fatigue  . Dizziness  . Shortness of Breath   Subjective:    HPI Patient is here today with concerns of fatigue, feeling dizzy/lightheaded, and SOB. Symptoms started last Thursday. Symptoms are unchanged since onset. Patient has not tried any treatments for symptoms.     Patient Active Problem List   Diagnosis Date Noted  . 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 Hernia, inguinal, right 12/30/2013  . 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 Hypertension 08/03/2007   Past Medical History:  Diagnosis Date  . Cancer ([place])   . H/O type B viral hepatitis 1960  . History of esophageal cancer   . History of kidney stones   . Hypertension    Past Surgical History:  Procedure Laterality Date  . APPENDECTOMY     During college  . CHOLECYSTECTOMY  1970  . HERNIA REPAIR Left 1970  . Resection of esophageal cancer  2003  . TONSILLECTOMY AND ADENOIDECTOMY     as a child  . VASECTOMY     Family History  Problem Relation Age of Onset  . Cancer Sister        Breast cancer   Allergies  Allergen Reactions  . Codeine Phosphate [Codeine] Other (See Comments) and Hives    Dizziness    Previous Medications   AMLODIPINE-BENAZEPRIL (LOTREL) 5-20 MG CAPSULE    Take 1 capsule by mouth daily.   HYOSCYAMINE (LEVSIN SL) 0.125 MG SL TABLET    Place 1 tablet (0.125 mg total) under the tongue every 4 (four) hours as needed.   METOPROLOL SUCCINATE (TOPROL-XL) 50 MG 24 HR TABLET    Take 1 tablet (50 mg total) by mouth daily.   MULTIPLE VITAMINS TABLET    Take by mouth.    Review of Systems  Constitutional: Positive for fatigue.  Respiratory: Positive for shortness of breath.   Cardiovascular: Negative.   Neurological: Positive for dizziness.     Social History  Substance Use Topics  . Smoking status: Former Smoker    Packs/day: 2.00    Years: 10.00    Quit date: 03/28/1972  . Smokeless tobacco: Never Used  . Alcohol use No   Objective:   BP 138/68 (BP Location: Right Arm, Patient Position: Sitting, Cuff Size: Normal)   Pulse 65   Temp 98.1 F (36.7 C) (Oral)   Wt 150 lb 6.4 oz (68.2 kg)   SpO2 97%   BMI 20.98 kg/m  Wt Readings from Last 3 Encounters:  10/26/16 150 lb 6.4 oz (68.2 kg)  09/28/16 151 lb 6.4 oz (68.7 kg)  07/16/16 152 lb (68.9 kg)    Physical Exam  Constitutional: He is oriented to person, place, and time. He appears well-developed and well-nourished.  HENT:  Head: Normocephalic.  Eyes: Conjunctivae are normal.  Neck: Neck supple. No thyromegaly present.  Cardiovascular: Normal rate and regular rhythm.   Pulmonary/Chest: Effort normal and breath sounds normal.  Abdominal: Soft. Bowel sounds are normal.  Musculoskeletal: Normal range of motion.  Neurological: He is alert and oriented to person, place, and time. He has normal reflexes.  Psychiatric: His behavior is normal. His mood appears anxious.      Assessment & Plan:     1. Light headed Was  working out of town when fatigue and lightheaded sensation hit on 10-21-16. 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 No nausea, vomiting or diarrhea. EKG show no significant or acute change from previous tracing of 12-02-09. Pulse oximetry 97%. Recommend increase fluid intake and may need cardiology evaluation for stress test if symptoms recur. - EKG 12-Lead  2. Dyspnea on exertion No palpitations, significant chest discomfort (history of esophageal cancer surgery in 2003. No significant or acute changes in EKG today. Good pulse ox level today. No congestion or cough. May need to consider cardiology evaluation if persisting.  Wants me to discuss with Dr. Rosanna Randy when he is back in the office, first. - EKG 12-Lead  3. Personal history of esophageal cancer Eating better with change in diet. Having less nausea or stomach upset/discomfort.

## 2016-11-26 DIAGNOSIS — H2513 Age-related nuclear cataract, bilateral: Secondary | ICD-10-CM | POA: Diagnosis not present

## 2016-11-26 DIAGNOSIS — H43813 Vitreous degeneration, bilateral: Secondary | ICD-10-CM | POA: Diagnosis not present

## 2017-01-10 ENCOUNTER — Encounter: Payer: Self-pay | Admitting: Family Medicine

## 2017-01-20 DIAGNOSIS — Z23 Encounter for immunization: Secondary | ICD-10-CM | POA: Diagnosis not present

## 2017-04-13 ENCOUNTER — Telehealth: Payer: Self-pay | Admitting: Family Medicine

## 2017-05-23 ENCOUNTER — Other Ambulatory Visit: Payer: Self-pay | Admitting: Family Medicine

## 2017-05-23 DIAGNOSIS — I10 Essential (primary) hypertension: Secondary | ICD-10-CM

## 2017-05-27 ENCOUNTER — Telehealth: Payer: Self-pay

## 2017-05-27 NOTE — Telephone Encounter (Signed)
Called pt to schedule AWV and pt states he will "think about it," and let us know if he's interested in scheduling an AWV. -MM

## 2017-06-30 NOTE — Telephone Encounter (Signed)
complete

## 2017-08-04 DIAGNOSIS — Z79899 Other long term (current) drug therapy: Secondary | ICD-10-CM | POA: Diagnosis not present

## 2017-08-04 DIAGNOSIS — Z08 Encounter for follow-up examination after completed treatment for malignant neoplasm: Secondary | ICD-10-CM | POA: Diagnosis not present

## 2017-08-04 DIAGNOSIS — Z8500 Personal history of malignant neoplasm of unspecified digestive organ: Secondary | ICD-10-CM | POA: Diagnosis not present

## 2017-08-04 DIAGNOSIS — R109 Unspecified abdominal pain: Secondary | ICD-10-CM | POA: Diagnosis not present

## 2017-08-04 DIAGNOSIS — Z87891 Personal history of nicotine dependence: Secondary | ICD-10-CM | POA: Diagnosis not present

## 2017-08-04 DIAGNOSIS — C49A0 Gastrointestinal stromal tumor, unspecified site: Secondary | ICD-10-CM | POA: Diagnosis not present

## 2017-08-09 ENCOUNTER — Telehealth: Payer: Self-pay | Admitting: Family Medicine

## 2017-08-09 DIAGNOSIS — Z8501 Personal history of malignant neoplasm of esophagus: Secondary | ICD-10-CM

## 2017-08-09 NOTE — Telephone Encounter (Signed)
Pt is requesting a referral to see Dr Vira Agar for abdominal-epigastric pain that he states has been going on for a year.A CT was ordered last year for this

## 2017-08-09 NOTE — Telephone Encounter (Signed)
Schedule referral to Dr. Vira Agar (GI) for persistent abdominal/epigastric discomfort and nausea for the past year with history of surgery for esophageal cancer - order in chart.

## 2017-09-13 ENCOUNTER — Other Ambulatory Visit: Payer: Self-pay

## 2017-09-13 ENCOUNTER — Encounter (INDEPENDENT_AMBULATORY_CARE_PROVIDER_SITE_OTHER): Payer: Self-pay

## 2017-09-13 ENCOUNTER — Encounter: Payer: Self-pay | Admitting: Gastroenterology

## 2017-09-13 ENCOUNTER — Ambulatory Visit (INDEPENDENT_AMBULATORY_CARE_PROVIDER_SITE_OTHER): Payer: Medicare Other | Admitting: Gastroenterology

## 2017-09-13 VITALS — BP 157/76 | HR 81 | Ht 71.0 in | Wt 143.6 lb

## 2017-09-13 DIAGNOSIS — R1013 Epigastric pain: Secondary | ICD-10-CM

## 2017-09-13 NOTE — Progress Notes (Signed)
Gastroenterology Consultation  Referring Provider:     Margo Common, PA Primary Care Physician:  Margo Common, PA Primary Gastroenterologist:  Dr. Allen Norris     Reason for Consultation:     Abdominal pain        HPI:   [name] is a 77 y.o. y/o male referred for consultation & management of Abdominal pain by Dr. Natale Milch, Vickki Muff, PA.  This patient comes in today with a history of having esophageal cancer.  The patient had what appears to be a esophagectomy with a gastric pull-through.  The patient states that this happened approximate 17 years ago and he has been going for yearly follow-ups at [facility] for his esophageal cancer.  The patient now reports that the last year has been different than the previous years with him now having chronic low-level abdominal pain where he is always aware of some discomfort in his abdomen.  He then gets approximately once a month and attack of abdominal pain that results in severe abdominal pain and diarrhea that usually last more than a day.  The pain will then decrease back to his baseline pain.  The patient states that he thought it may be something he eats but has not noticed any particular foods to make the symptoms any better or worse.  The patient also has not had a colonoscopy in the past.  There is no report of any black stools or bloody stools.  He also denies any nausea or vomiting. He does report some weight loss because of his aversion to eating.  Past Medical History:  Diagnosis Date  . Cancer ([place])   . H/O type B viral hepatitis 1960  . History of esophageal cancer   . History of kidney stones   . Hypertension     Past Surgical History:  Procedure Laterality Date  . APPENDECTOMY     During college  . CHOLECYSTECTOMY  1970  . HERNIA REPAIR Left 1970  . Resection of esophageal cancer  2003  . TONSILLECTOMY AND ADENOIDECTOMY     as a child  . VASECTOMY      Prior to Admission medications   Medication Sig Start Date End  Date Taking? Authorizing Provider  amLODipine-benazepril (LOTREL) 5-20 MG capsule TAKE 1 CAPSULE EVERY DAY 05/24/17  Yes Chrismon, Vickki Muff, PA  metoprolol succinate (TOPROL-XL) 50 MG 24 hr tablet TAKE 1 TABLET EVERY DAY 05/24/17  Yes Chrismon, Vickki Muff, PA  Multiple Vitamins tablet Take by mouth.   Yes [provider]  hyoscyamine (LEVSIN SL) 0.125 MG SL tablet Place 1 tablet (0.125 mg total) under the tongue every 4 (four) hours as needed. Patient not taking: Reported on 09/13/2017 07/16/16   Chrismon, Vickki Muff, PA    Family History  Problem Relation Age of Onset  . Cancer Sister        Breast cancer     Social History   Tobacco Use  . Smoking status: Former Smoker    Packs/day: 2.00    Years: 10.00    Pack years: 20.00    Last attempt to quit: 03/28/1972    Years since quitting: 45.4  . Smokeless tobacco: Never Used  Substance Use Topics  . Alcohol use: No  . Drug use: No    Allergies as of 09/13/2017 - Review Complete 09/13/2017  Allergen Reaction Noted  . Codeine phosphate [codeine] Other (See Comments) and Hives 05/01/2015    Review of Systems:    All systems  reviewed and negative except where noted in HPI.   Physical Exam:  BP (!) 157/76   Pulse 81   Ht 5\' 11"  (1.803 m)   Wt 143 lb 9.6 oz (65.1 kg)   BMI 20.03 kg/m  No LMP for male patient. General:   Alert,  Well-developed, well-nourished, pleasant and cooperative in NAD Head:  Normocephalic and atraumatic. Eyes:  Sclera clear, no icterus.   Conjunctiva pink. Ears:  Normal auditory acuity. Nose:  No deformity, discharge, or lesions. Mouth:  No deformity or lesions,oropharynx pink & moist. Neck:  Supple; no masses or thyromegaly. Lungs:  Respirations even and unlabored.  Clear throughout to auscultation.   No wheezes, crackles, or rhonchi. No acute distress. Heart:  Regular rate and rhythm; no murmurs, clicks, rubs, or gallops. Abdomen:  Normal bowel sounds.  No bruits.  Soft, non-tender and  non-distended without masses, hepatosplenomegaly or hernias noted.  No guarding or rebound tenderness.  Negative Carnett sign.   Rectal:  Deferred.  Msk:  Symmetrical without gross deformities.  Good, equal movement & strength bilaterally. Pulses:  Normal pulses noted. Extremities:  No clubbing or edema.  No cyanosis. Neurologic:  Alert and oriented x3;  grossly normal neurologically. Skin:  Intact without significant lesions or rashes.  No jaundice. Lymph Nodes:  No significant cervical adenopathy. Psych:  Alert and cooperative. Normal mood and affect.  Imaging Studies: No results found.  Assessment and Plan:   [name] is a 77 y.o. y/o male who comes in today with a history of a esophagectomy with gastric pull-through due to a personal history of esophageal cancer. The patient had been doing well for 16 years but now reports that for the last year he has been having some low level abdominal pain with a sharp pain with resulting diarrhea.  The patient has not had a screening colonoscopy in the past and will be set up for a screening colonoscopy.  The patient will also be set up for an upper endoscopy to look for source of his abdominal pain.  The patient had a breath test which was negative for H. Pylori.  The patient may be having abdominal pain due to bacterial overgrowth or intestinal spasms.  The patient will be started on a trial of dicyclomine 20 mg 3 times a day.  The patient will follow up at the time of the EGD and colonoscopy. I have discussed risks & benefits which include, but are not limited to, bleeding, infection, perforation & drug reaction.  The patient agrees with this plan & written consent will be obtained.     Lucilla Lame, MD. Marval Regal    Note: This dictation was prepared with Dragon dictation along with smaller phrase technology. Any transcriptional errors that result from this process are unintentional.

## 2017-09-14 ENCOUNTER — Other Ambulatory Visit: Payer: Self-pay

## 2017-09-14 DIAGNOSIS — R1013 Epigastric pain: Secondary | ICD-10-CM

## 2017-09-14 DIAGNOSIS — Z1211 Encounter for screening for malignant neoplasm of colon: Secondary | ICD-10-CM

## 2017-09-14 MED ORDER — NA SULFATE-K SULFATE-MG SULF 17.5-3.13-1.6 GM/177ML PO SOLN: 1.0000 | ORAL | 0 refills | Status: DC

## 2017-09-14 MED ORDER — DICYCLOMINE HCL 20 MG PO TABS: 20.0000 mg | ORAL_TABLET | Freq: Three times a day (TID) | ORAL | 0 refills | Status: DC

## 2017-09-21 NOTE — Discharge Instructions (Signed)
General Anesthesia, Adult, Care After °These instructions provide you with information about caring for yourself after your procedure. Your health care provider may also give you more specific instructions. Your treatment has been planned according to current medical practices, but problems sometimes occur. Call your health care provider if you have any problems or questions after your procedure. °What can I expect after the procedure? °After the procedure, it is common to have: °· Vomiting. °· A sore throat. °· Mental slowness. ° °It is common to feel: °· Nauseous. °· Cold or shivery. °· Sleepy. °· Tired. °· Sore or achy, even in parts of your body where you did not have surgery. ° °Follow these instructions at home: °For at least 24 hours after the procedure: °· Do not: °? Participate in activities where you could fall or become injured. °? Drive. °? Use heavy machinery. °? Drink alcohol. °? Take sleeping pills or medicines that cause drowsiness. °? Make important decisions or sign legal documents. °? Take care of children on your own. °· Rest. °Eating and drinking °· If you vomit, drink water, juice, or soup when you can drink without vomiting. °· Drink enough fluid to keep your urine clear or pale yellow. °· Make sure you have little or no nausea before eating solid foods. °· Follow the diet recommended by your health care provider. °General instructions °· Have a responsible adult stay with you until you are awake and alert. °· Return to your normal activities as told by your health care provider. Ask your health care provider what activities are safe for you. °· Take over-the-counter and prescription medicines only as told by your health care provider. °· If you smoke, do not smoke without supervision. °· Keep all follow-up visits as told by your health care provider. This is important. °Contact a health care provider if: °· You continue to have nausea or vomiting at home, and medicines are not helpful. °· You  cannot drink fluids or start eating again. °· You cannot urinate after 8-12 hours. °· You develop a skin rash. °· You have fever. °· You have increasing redness at the site of your procedure. °Get help right away if: °· You have difficulty breathing. °· You have chest pain. °· You have unexpected bleeding. °· You feel that you are having a life-threatening or urgent problem. °This information is not intended to replace advice given to you by your health care provider. Make sure you discuss any questions you have with your health care provider. °Document Released: 06/21/2000 Document Revised: 08/18/2015 Document Reviewed: 02/27/2015 °Elsevier Interactive Patient Education © 2018 [employer] ° °

## 2017-09-22 ENCOUNTER — Ambulatory Visit: Payer: Medicare Other | Admitting: Anesthesiology

## 2017-09-22 ENCOUNTER — Ambulatory Visit
Admission: RE | Admit: 2017-09-22 | Discharge: 2017-09-22 | Disposition: A | Discharge status: Home / Self Care | Payer: Medicare Other | Source: Ambulatory Visit | Attending: Gastroenterology | Admitting: Gastroenterology

## 2017-09-22 ENCOUNTER — Encounter
Admission: RE | Disposition: A | Discharge status: Home / Self Care | Payer: Self-pay | Source: Ambulatory Visit | Attending: Gastroenterology

## 2017-09-22 DIAGNOSIS — Z87442 Personal history of urinary calculi: Secondary | ICD-10-CM | POA: Insufficient documentation

## 2017-09-22 DIAGNOSIS — M19079 Primary osteoarthritis, unspecified ankle and foot: Secondary | ICD-10-CM | POA: Insufficient documentation

## 2017-09-22 DIAGNOSIS — Z8501 Personal history of malignant neoplasm of esophagus: Secondary | ICD-10-CM | POA: Insufficient documentation

## 2017-09-22 DIAGNOSIS — Z885 Allergy status to narcotic agent status: Secondary | ICD-10-CM | POA: Diagnosis not present

## 2017-09-22 DIAGNOSIS — K641 Second degree hemorrhoids: Secondary | ICD-10-CM | POA: Insufficient documentation

## 2017-09-22 DIAGNOSIS — Z87891 Personal history of nicotine dependence: Secondary | ICD-10-CM | POA: Diagnosis not present

## 2017-09-22 DIAGNOSIS — I10 Essential (primary) hypertension: Secondary | ICD-10-CM | POA: Diagnosis not present

## 2017-09-22 DIAGNOSIS — M795 Residual foreign body in soft tissue: Secondary | ICD-10-CM | POA: Diagnosis not present

## 2017-09-22 DIAGNOSIS — Z1211 Encounter for screening for malignant neoplasm of colon: Secondary | ICD-10-CM

## 2017-09-22 DIAGNOSIS — K5730 Diverticulosis of large intestine without perforation or abscess without bleeding: Secondary | ICD-10-CM | POA: Insufficient documentation

## 2017-09-22 DIAGNOSIS — Z79899 Other long term (current) drug therapy: Secondary | ICD-10-CM | POA: Diagnosis not present

## 2017-09-22 DIAGNOSIS — R1013 Epigastric pain: Secondary | ICD-10-CM

## 2017-09-22 DIAGNOSIS — D122 Benign neoplasm of ascending colon: Secondary | ICD-10-CM | POA: Diagnosis not present

## 2017-09-22 DIAGNOSIS — T182XXA Foreign body in stomach, initial encounter: Secondary | ICD-10-CM

## 2017-09-22 DIAGNOSIS — M19039 Primary osteoarthritis, unspecified wrist: Secondary | ICD-10-CM | POA: Insufficient documentation

## 2017-09-22 HISTORY — PX: POLYPECTOMY: SHX5525

## 2017-09-22 HISTORY — DX: Unspecified temporomandibular joint disorder, unspecified side: M26.609

## 2017-09-22 HISTORY — DX: Unspecified osteoarthritis, unspecified site: M19.90

## 2017-09-22 HISTORY — PX: COLONOSCOPY WITH PROPOFOL: SHX5780

## 2017-09-22 HISTORY — PX: ESOPHAGOGASTRODUODENOSCOPY (EGD) WITH PROPOFOL: SHX5813

## 2017-09-22 SURGERY — COLONOSCOPY WITH PROPOFOL
Anesthesia: General | Site: Throat | Wound class: Contaminated

## 2017-09-22 MED ORDER — PROPOFOL 10 MG/ML IV BOLUS
INTRAVENOUS | Status: DC | PRN
  Administered 2017-09-22: 20 mg via INTRAVENOUS
  Administered 2017-09-22 (×2): 40 mg via INTRAVENOUS
  Administered 2017-09-22 (×2): 20 mg via INTRAVENOUS
  Administered 2017-09-22: 30 mg via INTRAVENOUS
  Administered 2017-09-22: 100 mg via INTRAVENOUS
  Administered 2017-09-22: 20 mg via INTRAVENOUS

## 2017-09-22 MED ORDER — SODIUM CHLORIDE 0.9 % IV SOLN: INTRAVENOUS | Status: DC

## 2017-09-22 MED ORDER — LIDOCAINE HCL (CARDIAC) PF 100 MG/5ML IV SOSY
PREFILLED_SYRINGE | INTRAVENOUS | Status: DC | PRN
  Administered 2017-09-22: 30 mg via INTRAVENOUS

## 2017-09-22 MED ORDER — STERILE WATER FOR IRRIGATION IR SOLN
Status: DC | PRN
  Administered 2017-09-22: .5 mL

## 2017-09-22 MED ORDER — LACTATED RINGERS IV SOLN
10.0000 mL/h | INTRAVENOUS | Status: DC
  Administered 2017-09-22: 10 mL/h via INTRAVENOUS

## 2017-09-22 MED ORDER — ONDANSETRON HCL 4 MG/2ML IJ SOLN: 4.0000 mg | Freq: Once | INTRAMUSCULAR | Status: DC | PRN

## 2017-09-22 MED ORDER — PHENYLEPHRINE HCL 10 MG/ML IJ SOLN
INTRAMUSCULAR | Status: DC | PRN
  Administered 2017-09-22 (×2): 50 ug via INTRAVENOUS

## 2017-09-22 MED ORDER — GLYCOPYRROLATE 0.2 MG/ML IJ SOLN
INTRAMUSCULAR | Status: DC | PRN
  Administered 2017-09-22: 0.1 mg via INTRAVENOUS

## 2017-09-22 SURGICAL SUPPLY — 7 items
BLOCK BITE 60FR ADLT L/F GRN (MISCELLANEOUS) ×5 IMPLANT
CANISTER SUCT 1200ML W/VALVE (MISCELLANEOUS) ×5 IMPLANT
FORCEPS BIOP RAD 4 LRG CAP 4 (CUTTING FORCEPS) ×5 IMPLANT
GOWN CVR UNV OPN BCK APRN NK (MISCELLANEOUS) ×6 IMPLANT
GOWN ISOL THUMB LOOP REG UNIV (MISCELLANEOUS) ×4
KIT ENDO PROCEDURE OLY (KITS) ×5 IMPLANT
WATER STERILE IRR 250ML POUR (IV SOLUTION) ×5 IMPLANT

## 2017-09-22 NOTE — Anesthesia Procedure Notes (Signed)
Performed by: Jakhi Dishman, CRNA Pre-anesthesia Checklist: Patient identified, Emergency Drugs available, Suction available, Timeout performed and Patient being monitored Patient Re-evaluated:Patient Re-evaluated prior to induction Oxygen Delivery Method: Nasal cannula Placement Confirmation: positive ETCO2       

## 2017-09-22 NOTE — H&P (Signed)
Lucilla Lame, MD [facility] [address], [place], [place] [address] Phone:[phone] Fax : [phone]  Primary Care Physician:  Margo Common, [place] Primary Gastroenterologist:  Dr. Allen Norris  Pre-Procedure History & Physical: HPI:  [name] is a 77 y.o. male is here for an endoscopy and colonoscopy.   Past Medical History:  Diagnosis Date  . Arthritis    wrist and ankle  . Cancer ([place])   . H/O type B viral hepatitis 1960  . History of esophageal cancer   . History of kidney stones   . Hypertension   . TMJ (temporomandibular joint syndrome)    has had a issure with this lately    Past Surgical History:  Procedure Laterality Date  . APPENDECTOMY     During college  . CHOLECYSTECTOMY  1970  . HERNIA REPAIR Left 1970  . Resection of esophageal cancer  2003  . TONSILLECTOMY AND ADENOIDECTOMY     as a child  . VASECTOMY      Prior to Admission medications   Medication Sig Start Date End Date Taking? Authorizing Provider  amLODipine-benazepril (LOTREL) 5-20 MG capsule TAKE 1 CAPSULE EVERY DAY 05/24/17  Yes Chrismon, Vickki Muff, PA  metoprolol succinate (TOPROL-XL) 50 MG 24 hr tablet TAKE 1 TABLET EVERY DAY 05/24/17  Yes Chrismon, [name] E, PA  Na Sulfate-K Sulfate-Mg Sulf (SUPREP BOWEL PREP KIT) 17.5-3.13-1.6 GM/177ML SOLN Take 1 kit by mouth as directed. 09/14/17  Yes Lucilla Lame, MD  dicyclomine (BENTYL) 20 MG tablet Take 1 tablet (20 mg total) by mouth 3 (three) times daily before meals. Patient not taking: Reported on 09/15/2017 09/14/17   Lucilla Lame, MD  hyoscyamine (LEVSIN SL) 0.125 MG SL tablet Place 1 tablet (0.125 mg total) under the tongue every 4 (four) hours as needed. Patient not taking: Reported on 09/13/2017 07/16/16   Chrismon, Vickki Muff, PA  Multiple Vitamins tablet Take by mouth.    [provider]    Allergies as of 09/14/2017 - Review Complete 09/13/2017  Allergen Reaction Noted  . Codeine phosphate [codeine] Other (See Comments) and  Hives 05/01/2015    Family History  Problem Relation Age of Onset  . Cancer Sister        Breast cancer    Social History   Socioeconomic History  . Marital status: Married    Spouse name: Not on file  . Number of children: Not on file  . Years of education: Not on file  . Highest education level: Not on file  Occupational History  . Not on file  Social Needs  . Financial resource strain: Not on file  . Food insecurity:    Worry: Not on file    Inability: Not on file  . Transportation needs:    Medical: Not on file    Non-medical: Not on file  Tobacco Use  . Smoking status: Former Smoker    Packs/day: 2.00    Years: 10.00    Pack years: 20.00    Last attempt to quit: 03/28/1972    Years since quitting: 45.5  . Smokeless tobacco: Never Used  Substance and Sexual Activity  . Alcohol use: No  . Drug use: No  . Sexual activity: Not on file  Lifestyle  . Physical activity:    Days per week: Not on file    Minutes per session: Not on file  . Stress: Not on file  Relationships  . Social connections:    Talks on phone: Not on file  Gets together: Not on file    Attends religious service: Not on file    Active member of club or organization: Not on file    Attends meetings of clubs or organizations: Not on file    Relationship status: Not on file  . Intimate partner violence:    Fear of current or ex partner: Not on file    Emotionally abused: Not on file    Physically abused: Not on file    Forced sexual activity: Not on file  Other Topics Concern  . Not on file  Social History Narrative  . Not on file    Review of Systems: See HPI, otherwise negative ROS  Physical Exam: BP (!) 165/95   Pulse 92   Temp 99 F (37.2 C) (Temporal)   Resp 16   Ht _0  (1.803 m)   Wt 138 lb (62.6 kg)   SpO2 99%   BMI 19.25 kg/m  General:   Alert,  pleasant and cooperative in NAD Head:  Normocephalic and atraumatic. Neck:  Supple; no masses or thyromegaly. Lungs:   Clear throughout to auscultation.    Heart:  Regular rate and rhythm. Abdomen:  Soft, nontender and nondistended. Normal bowel sounds, without guarding, and without rebound.   Neurologic:  Alert and  oriented x4;  grossly normal neurologically.  Impression/Plan: [name] is here for an endoscopy and colonoscopy to be performed for epigastric pain and screening colonscopy  Risks, benefits, limitations, and alternatives regarding  endoscopy and colonoscopy have been reviewed with the patient.  Questions have been answered.  All parties agreeable.   Lucilla Lame, MD  09/22/2017, 9:20 AM

## 2017-09-22 NOTE — Anesthesia Postprocedure Evaluation (Signed)
Anesthesia Post Note  Patient: [name]  Procedure(s) Performed: COLONOSCOPY WITH PROPOFOL (N/A Rectum) ESOPHAGOGASTRODUODENOSCOPY (EGD) WITH PROPOFOL (N/A Throat) POLYPECTOMY (Rectum)  Patient location during evaluation: PACU Anesthesia Type: General Level of consciousness: awake and alert Pain management: pain level controlled Vital Signs Assessment: post-procedure vital signs reviewed and stable Respiratory status: spontaneous breathing Cardiovascular status: blood pressure returned to baseline Postop Assessment: no headache Anesthetic complications: no    Jaci Standard, III,  Yue Glasheen D

## 2017-09-22 NOTE — Anesthesia Preprocedure Evaluation (Signed)
Anesthesia Evaluation  Patient identified by MRN, date of birth, ID band Patient awake    Reviewed: Allergy & Precautions, H&P , NPO status , Patient's Chart, lab work & pertinent test results  Airway Mallampati: II  TM Distance: >3 FB Neck ROM: full    Dental no notable dental hx.    Pulmonary former smoker,    Pulmonary exam normal        Cardiovascular hypertension, On Medications Normal cardiovascular exam     Neuro/Psych    GI/Hepatic negative GI ROS, Neg liver ROS,   Endo/Other  negative endocrine ROS  Renal/GU negative Renal ROS     Musculoskeletal   Abdominal   Peds  Hematology negative hematology ROS (+)   Anesthesia Other Findings   Reproductive/Obstetrics negative OB ROS                             Anesthesia Physical Anesthesia Plan  ASA: II  Anesthesia Plan: General   Post-op Pain Management:    Induction:   PONV Risk Score and Plan:   Airway Management Planned:   Additional Equipment:   Intra-op Plan:   Post-operative Plan:   Informed Consent: I have reviewed the patients History and Physical, chart, labs and discussed the procedure including the risks, benefits and alternatives for the proposed anesthesia with the patient or authorized representative who has indicated his/her understanding and acceptance.     Plan Discussed with:   Anesthesia Plan Comments:         Anesthesia Quick Evaluation

## 2017-09-22 NOTE — Op Note (Signed)
[facility] Gastroenterology Patient Name: [name] Procedure Date: 09/22/2017 9:43 AM MRN: [id number] Account #: [id number] Date of Birth: January 28, 1941 Admit Type: Outpatient Age: 77 Room: [facility] OR ROOM 01 Gender: Male Note Status: Finalized Procedure:            Upper GI endoscopy Indications:          Epigastric abdominal pain Providers:            Lucilla Lame MD, MD Referring MD:         Joni Reining. Zenia Resides MD, MD (Referring MD), Vickki Muff.                        Chrismon, MD (Referring MD) Medicines:            Propofol per Anesthesia Complications:        No immediate complications. Procedure:            Pre-Anesthesia Assessment:                       - Prior to the procedure, a History and Physical was                        performed, and patient medications and allergies were                        reviewed. The patient's tolerance of previous                        anesthesia was also reviewed. The risks and benefits of                        the procedure and the sedation options and risks were                        discussed with the patient. All questions were                        answered, and informed consent was obtained. Prior                        Anticoagulants: The patient has taken no previous                        anticoagulant or antiplatelet agents. ASA Grade                        Assessment: II - A patient with mild systemic disease.                        After reviewing the risks and benefits, the patient was                        deemed in satisfactory condition to undergo the                        procedure.                       After obtaining informed consent, the endoscope was  passed under direct vision. Throughout the procedure,                        the patient's blood pressure, pulse, and oxygen                        saturations were monitored continuously. The was   introduced through the mouth, and advanced to the                        second part of duodenum. The upper GI endoscopy was                        accomplished without difficulty. The patient tolerated                        the procedure well. Findings:      An esophago-gastric anastomosis was found in the upper third of the       esophagus.      A suture was found in the gastric body. Removal was accomplished with a       regular forceps.      A staple was found in the gastric fundus. Removal was accomplished with       a regular forceps.      The examined duodenum was normal. Impression:           - An esophago-gastric anastomosis was found.                       - A suture was found in the stomach. Removal was                        successful.                       - A staple was found in the stomach. Removal was                        successful.                       - Normal examined duodenum. Recommendation:       - Discharge patient to home.                       - Resume previous diet.                       - Continue present medications.                       - Perform a colonoscopy today. Procedure Code(s):    --- Professional ---                       [phone], Esophagogastroduodenoscopy, flexible, transoral;                        with removal of foreign body(s) Diagnosis Code(s):    --- Professional ---                       R10.13, Epigastric pain  T18.2XXA, Foreign body in stomach, initial encounter CPT copyright 2017 American Medical Association. All rights reserved. The codes documented in this report are preliminary and upon coder review may  be revised to meet current compliance requirements. Lucilla Lame MD, MD 09/22/2017 10:05:47 AM This report has been signed electronically. Number of Addenda: 0 Note Initiated On: 09/22/2017 9:43 AM      [facility]

## 2017-09-22 NOTE — Op Note (Addendum)
[facility] Gastroenterology Patient Name: [name] Procedure Date: 09/22/2017 9:42 AM MRN: [id number] Account #: [id number] Date of Birth: [birth year] Admit Type: Outpatient Age: 77 Room: [facility] OR ROOM 01 Gender: Male Note Status: Finalized Procedure:            Colonoscopy Indications:          Screening for colorectal malignant neoplasm Providers:            Lucilla Lame MD, MD Medicines:            Propofol per Anesthesia Complications:        No immediate complications. Procedure:            Pre-Anesthesia Assessment:                       - Prior to the procedure, a History and Physical was                        performed, and patient medications and allergies were                        reviewed. The patient's tolerance of previous                        anesthesia was also reviewed. The risks and benefits of                        the procedure and the sedation options and risks were                        discussed with the patient. All questions were                        answered, and informed consent was obtained. Prior                        Anticoagulants: The patient has taken no previous                        anticoagulant or antiplatelet agents. ASA Grade                        Assessment: II - A patient with mild systemic disease.                        After reviewing the risks and benefits, the patient was                        deemed in satisfactory condition to undergo the                        procedure.                       After obtaining informed consent, the colonoscope was                        passed under direct vision. Throughout the procedure,                        the patient's blood pressure,  pulse, and oxygen                        saturations were monitored continuously. The was                        introduced through the anus and advanced to the the                        terminal ileum. The colonoscopy was  performed without                        difficulty. The patient tolerated the procedure well.                        The quality of the bowel preparation was excellent. Findings:      The perianal and digital rectal examinations were normal.      A few small-mouthed diverticula were found in the entire colon.      The terminal ileum appeared normal. Biopsies were taken with a cold       forceps for histology.      Random biopsies were obtained with cold forceps for histology randomly.      Non-bleeding internal hemorrhoids were found during retroflexion. The       hemorrhoids were Grade II (internal hemorrhoids that prolapse but reduce       spontaneously).      A 2 mm polyp was found in the ascending colon. The polyp was sessile.       The polyp was removed with a cold biopsy forceps. Resection and       retrieval were complete. Impression:           - Diverticulosis in the entire examined colon.                       - The examined portion of the ileum was normal.                        Biopsied.                       - Non-bleeding internal hemorrhoids.                       - One 2 mm polyp in the ascending colon, removed with a                        cold biopsy forceps. Resected and retrieved.                       - Random biopsies were obtained. Recommendation:       - Discharge patient to home.                       - Resume previous diet.                       - Continue present medications.                       - Await pathology results. Procedure Code(s):    --- Professional ---  45380, Colonoscopy, flexible; with biopsy, single or                        multiple Diagnosis Code(s):    --- Professional ---                       Z12.11, Encounter for screening for malignant neoplasm                        of colon                       D12.2, Benign neoplasm of ascending colon CPT copyright 2017 American Medical Association. All rights reserved. The  codes documented in this report are preliminary and upon coder review may  be revised to meet current compliance requirements. Lucilla Lame MD, MD 09/22/2017 10:21:38 AM This report has been signed electronically. Number of Addenda: 0 Note Initiated On: 09/22/2017 9:42 AM Scope Withdrawal Time: 0 hours 10 minutes 1 second  Total Procedure Duration: 0 hours 12 minutes 11 seconds       [facility]

## 2017-09-22 NOTE — Transfer of Care (Signed)
Immediate Anesthesia Transfer of Care Note  Patient: [name] [id number]  Procedure(s) Performed: COLONOSCOPY WITH PROPOFOL (N/A Rectum) ESOPHAGOGASTRODUODENOSCOPY (EGD) WITH PROPOFOL (N/A Throat) POLYPECTOMY (Rectum)  Patient Location: PACU  Anesthesia Type: General  Level of Consciousness: awake, alert  and patient cooperative  Airway and Oxygen Therapy: Patient Spontanous Breathing and Patient connected to supplemental oxygen  Post-op Assessment: Post-op Vital signs reviewed, Patient's Cardiovascular Status Stable, Respiratory Function Stable, Patent Airway and No signs of Nausea or vomiting  Post-op Vital Signs: Reviewed and stable  Complications: No apparent anesthesia complications

## 2017-09-23 ENCOUNTER — Encounter: Payer: Self-pay | Admitting: Gastroenterology

## 2017-10-03 ENCOUNTER — Encounter: Payer: Self-pay | Admitting: Gastroenterology

## 2017-10-04 ENCOUNTER — Encounter: Payer: Self-pay | Admitting: Gastroenterology

## 2017-10-14 ENCOUNTER — Other Ambulatory Visit: Payer: Self-pay

## 2017-10-14 MED ORDER — DICYCLOMINE HCL 20 MG PO TABS: 20.0000 mg | ORAL_TABLET | Freq: Three times a day (TID) | ORAL | 3 refills | Status: DC

## 2017-10-18 DIAGNOSIS — M19031 Primary osteoarthritis, right wrist: Secondary | ICD-10-CM | POA: Diagnosis not present

## 2017-10-26 ENCOUNTER — Telehealth: Payer: Self-pay | Admitting: Gastroenterology

## 2017-10-26 NOTE — Telephone Encounter (Signed)
Pt left vm for Ginger he has a question about rx he is taking and about some potential side effects he might be experiencing please call

## 2017-10-28 NOTE — Telephone Encounter (Signed)
Pt stated ever since he started the dicyclomine he has been really fatigue and lightheaded. He doesn't know if this is also a side effect but he does have an enlarged prostate and recently has been having trouble urinating. He said this didn't start until he started the medication. I have advised him to stop the medication and contact me on Monday if symptoms haven't resolved.

## 2017-11-11 DIAGNOSIS — H2513 Age-related nuclear cataract, bilateral: Secondary | ICD-10-CM | POA: Diagnosis not present

## 2017-12-01 ENCOUNTER — Telehealth: Payer: Self-pay

## 2017-12-01 DIAGNOSIS — L812 Freckles: Secondary | ICD-10-CM | POA: Diagnosis not present

## 2017-12-01 DIAGNOSIS — L821 Other seborrheic keratosis: Secondary | ICD-10-CM | POA: Diagnosis not present

## 2017-12-01 DIAGNOSIS — D225 Melanocytic nevi of trunk: Secondary | ICD-10-CM | POA: Diagnosis not present

## 2017-12-01 DIAGNOSIS — D1800 Hemangioma unspecified site: Secondary | ICD-10-CM | POA: Diagnosis not present

## 2017-12-01 DIAGNOSIS — D485 Neoplasm of uncertain behavior of skin: Secondary | ICD-10-CM | POA: Diagnosis not present

## 2017-12-01 DIAGNOSIS — Z1283 Encounter for screening for malignant neoplasm of skin: Secondary | ICD-10-CM | POA: Diagnosis not present

## 2017-12-01 DIAGNOSIS — L820 Inflamed seborrheic keratosis: Secondary | ICD-10-CM | POA: Diagnosis not present

## 2017-12-01 DIAGNOSIS — L578 Other skin changes due to chronic exposure to nonionizing radiation: Secondary | ICD-10-CM | POA: Diagnosis not present

## 2017-12-01 NOTE — Telephone Encounter (Signed)
Pt advise of recommendation. Pt will give it a few days to see how things go. If no improvement by Monday (12/05/17) he will call me back and we will set up a follow up appt to see Dr. Allen Norris.

## 2017-12-01 NOTE — Telephone Encounter (Signed)
Pt called today stated 2 days ago he started having sharp abdominal pain where his stomach would be. It hurts when he walks. Please review recent EGD results. You gave him some dicycomine to try but had to stop it due to side effects. Please advise. Should he come back in for a follow up>

## 2017-12-01 NOTE — Telephone Encounter (Signed)
If the symptoms are worse when he walks it may be musculoskeletal although he has had gastric surgery.  If his symptoms do not improve he should come to the office and if they become much worse he should go to the ER.

## 2017-12-05 ENCOUNTER — Encounter: Payer: Self-pay | Admitting: Gastroenterology

## 2017-12-05 ENCOUNTER — Ambulatory Visit (INDEPENDENT_AMBULATORY_CARE_PROVIDER_SITE_OTHER): Payer: Medicare Other | Admitting: Gastroenterology

## 2017-12-05 VITALS — BP 139/75 | HR 64 | Ht 71.0 in | Wt 145.0 lb

## 2017-12-05 DIAGNOSIS — R1084 Generalized abdominal pain: Secondary | ICD-10-CM | POA: Diagnosis not present

## 2017-12-05 MED ORDER — DICYCLOMINE HCL 10 MG PO CAPS: 10.0000 mg | ORAL_CAPSULE | Freq: Three times a day (TID) | ORAL | 3 refills | Status: DC

## 2017-12-05 NOTE — Progress Notes (Signed)
Primary Care Physician: Jerrol Banana., MD  Primary Gastroenterologist:  Dr. Lucilla Lame  Chief Complaint  Patient presents with  . Follow up abdominal pain    HPI: [name] is a 77 y.o. male here for follow-up after having an EGD for abdominal pain.  The patient has had two CT scans for the abdominal pain including one at [facility] with no source of his pain seen. The patient had an upper endoscopy by me and had staples and sutures removed. He states he was doing well after the EGD for a few weeks but then had pain in the middle of the night. The patient reports that he woke up in the middle night to urinate and when he went back to bed started to have severe abdominal pain. The pain was on the Right flank going down to his groin and also was in the epigastric area with mild pain on the Left side.  The patient states this severe pain went away but he was left with residual pain for a weeks' time.  He denies any black stools or bloody stools.  He also denies any association with eating or drinking.  Current Outpatient Medications  Medication Sig Dispense Refill  . amLODipine-benazepril (LOTREL) 5-20 MG capsule TAKE 1 CAPSULE EVERY DAY 90 capsule 3  . metoprolol succinate (TOPROL-XL) 50 MG 24 hr tablet TAKE 1 TABLET EVERY DAY 90 tablet 3  . dicyclomine (BENTYL) 10 MG capsule Take 1 capsule (10 mg total) by mouth 4 (four) times daily -  before meals and at bedtime. 120 capsule 3  . hyoscyamine (LEVSIN SL) 0.125 MG SL tablet Place 1 tablet (0.125 mg total) under the tongue every 4 (four) hours as needed. (Patient not taking: Reported on 09/13/2017) 30 tablet 0  . Multiple Vitamins tablet Take by mouth.     No current facility-administered medications for this visit.     Allergies as of 12/05/2017 - Review Complete 12/05/2017  Allergen Reaction Noted  . Codeine phosphate [codeine] Other (See Comments) and Hives 05/01/2015    ROS:  General: Negative for  anorexia, weight loss, fever, chills, fatigue, weakness. ENT: Negative for hoarseness, difficulty swallowing , nasal congestion. CV: Negative for chest pain, angina, palpitations, dyspnea on exertion, peripheral edema.  Respiratory: Negative for dyspnea at rest, dyspnea on exertion, cough, sputum, wheezing.  GI: See history of present illness. GU:  Negative for dysuria, hematuria, urinary incontinence, urinary frequency, nocturnal urination.  Endo: Negative for unusual weight change.    Physical Examination:   BP 139/75   Pulse 64   Ht 5\' 11"  (1.803 m)   Wt 145 lb (65.8 kg)   BMI 20.22 kg/m   General: Well-nourished, well-developed in no acute distress.  Eyes: No icterus. Conjunctivae pink. Mouth: Oropharyngeal mucosa moist and pink , no lesions erythema or exudate. Lungs: Clear to auscultation bilaterally. Non-labored. Heart: Regular rate and rhythm, no murmurs rubs or gallops.  Abdomen: Bowel sounds are normal, nontender, nondistended, no hepatosplenomegaly or masses, no abdominal bruits or hernia , no rebound or guarding.   Extremities: No lower extremity edema. No clubbing or deformities. Neuro: Alert and oriented x 3.  Grossly intact. Skin: Warm and dry, no jaundice.   Psych: Alert and cooperative, normal mood and affect.  Labs:    Imaging Studies: No results found.  Assessment and Plan:   [name] is a 77 y.o. y/o male who comes in with abdominal pain that is on the Right  side and goes from his ribs down to his groin. The patient also has lesser pain on the left side.  On physical exam the pain is reproducible with deep palpation but not with muscle flexion.  The patient has a scar on the right side from his cholecystectomy and on his right lower quadrant from appendectomy that he states burst when he was 77 years old.  The patient also has a midline scar from his esophagectomy with gastric pull-through.  There is no obvious cause for the patient's abdominal pain and  he states that the dicyclomine 20 mg 3 times a day made his BPH worse and harder to urinate.  The patient has been told to start taking 10 mg 3 times a day and titrate lower to see if that well help his abdominal pain.  The patient has also been told that if this does not work we may want to try Flexeril for muscular pain.  The patient has been explained the plan and agrees with it.    Lucilla Lame, MD. Marval Regal   Note: This dictation was prepared with Dragon dictation along with smaller phrase technology. Any transcriptional errors that result from this process are unintentional.

## 2017-12-15 ENCOUNTER — Telehealth: Payer: Self-pay

## 2017-12-15 NOTE — Telephone Encounter (Signed)
Pt called back to day stating he has tried the reduced Dicyclomine and is not having the issues he was having before. He stated he is having a lot of nausea but no vomiting. He said the abdominal pain is a little to the left side and starts moving down. It also hurts when he presses on it. He doesn't really feel its muscular. He is wanting to know if you feel you have exhausted your efforts from a GI standpoint? If so, he will contact his PCP and make an appt to see if there is something else could be causing this issue.

## 2017-12-16 NOTE — Telephone Encounter (Signed)
The patients last CT was 2018. That would be the only thing more I could do would be to repeat it.

## 2017-12-19 NOTE — Telephone Encounter (Signed)
Pt.notified

## 2017-12-26 ENCOUNTER — Ambulatory Visit (INDEPENDENT_AMBULATORY_CARE_PROVIDER_SITE_OTHER): Payer: Medicare Other | Admitting: Family Medicine

## 2017-12-26 ENCOUNTER — Encounter: Payer: Self-pay | Admitting: Family Medicine

## 2017-12-26 VITALS — BP 138/76 | HR 64 | Temp 97.9°F | Resp 16 | Ht 71.0 in | Wt 143.0 lb

## 2017-12-26 DIAGNOSIS — E785 Hyperlipidemia, unspecified: Secondary | ICD-10-CM | POA: Diagnosis not present

## 2017-12-26 DIAGNOSIS — Z1211 Encounter for screening for malignant neoplasm of colon: Secondary | ICD-10-CM | POA: Diagnosis not present

## 2017-12-26 DIAGNOSIS — Z23 Encounter for immunization: Secondary | ICD-10-CM

## 2017-12-26 DIAGNOSIS — I10 Essential (primary) hypertension: Secondary | ICD-10-CM | POA: Diagnosis not present

## 2017-12-26 DIAGNOSIS — R109 Unspecified abdominal pain: Secondary | ICD-10-CM | POA: Insufficient documentation

## 2017-12-26 DIAGNOSIS — R110 Nausea: Secondary | ICD-10-CM | POA: Diagnosis not present

## 2017-12-26 DIAGNOSIS — C269 Malignant neoplasm of ill-defined sites within the digestive system: Secondary | ICD-10-CM | POA: Diagnosis not present

## 2017-12-26 NOTE — Patient Instructions (Signed)
No dairy products in October.  No gluten products in November.

## 2017-12-26 NOTE — Progress Notes (Signed)
Patient: [name] Male    DOB: February 16, 1941   77 y.o.   MRN: [id number] Visit Date: 12/26/2017  Today's Provider: [name], MD   Chief Complaint  Patient presents with  . Abdominal Pain  . Nausea   Subjective:    HPI  Patient comes in today wanting to discuss the colonoscopy and endoscopy that he had done about 3 months ago. He reports that everything was normal. However, he is having more nausea than usual with associated abdominal pain. He denies vomiting. He describes it as a dull ache. He is currently on the dicyclomine, but reports that this has not helped with his symptoms. He denies fever or changes in bowels.  The pain is mainly epigastric with associated nausea.  He then has cramps in all of this last for 4 to 5 days.  Here he has had to complete CT scans, he has also had EGD and colonoscopy.  At the time of the EGD stitch and the staples were removed from this esophagus.  For about 2 weeks he felt much better.  He also states that occasionally he feels sweats and lightheaded and when he checks his blood sugar with when this happens it is about 70.  He is not diabetic and has never been on any medications.  He has a grandson that has gluten intolerance and he wonders if this could be part of the issue.    Allergies  Allergen Reactions  . Codeine Phosphate [Codeine] Other (See Comments) and Hives    Dizziness     Current Outpatient Medications:  .  amLODipine-benazepril (LOTREL) 5-20 MG capsule, TAKE 1 CAPSULE EVERY DAY, Disp: 90 capsule, Rfl: 3 .  dicyclomine (BENTYL) 10 MG capsule, Take 1 capsule (10 mg total) by mouth 4 (four) times daily -  before meals and at bedtime., Disp: 120 capsule, Rfl: 3 .  metoprolol succinate (TOPROL-XL) 50 MG 24 hr tablet, TAKE 1 TABLET EVERY DAY, Disp: 90 tablet, Rfl: 3 .  hyoscyamine (LEVSIN SL) 0.125 MG SL tablet, Place 1 tablet (0.125 mg total) under the tongue every 4 (four) hours as needed. (Patient not taking:  Reported on 09/13/2017), Disp: 30 tablet, Rfl: 0 .  Multiple Vitamins tablet, Take by mouth., Disp: , Rfl:   Review of Systems  Constitutional: Positive for activity change and appetite change. Negative for fatigue.  HENT: Negative.   Eyes: Negative.   Gastrointestinal: Positive for abdominal pain and nausea. Negative for anal bleeding, blood in stool, constipation, diarrhea, rectal pain and vomiting.  Endocrine: Negative.   Genitourinary: Negative.   Skin: Negative.   Allergic/Immunologic: Negative.   Neurological: Negative for light-headedness and headaches.  Psychiatric/Behavioral: The patient is nervous/anxious.        Possible mild anxiety over his health.    Social History   Tobacco Use  . Smoking status: Former Smoker    Packs/day: 2.00    Years: 10.00    Pack years: 20.00    Last attempt to quit: 03/28/1972    Years since quitting: 45.7  . Smokeless tobacco: Never Used  Substance Use Topics  . Alcohol use: No   Objective:   BP 138/76 (BP Location: Left Arm, Patient Position: Sitting, Cuff Size: Normal)   Pulse 64   Temp 97.9 F (36.6 C)   Resp 16   Ht 5\' 11"  (1.803 m)   Wt 143 lb (64.9 kg)   SpO2 97%   BMI 19.94 kg/m  Vitals:  12/26/17 1130  BP: 138/76  Pulse: 64  Resp: 16  Temp: 97.9 F (36.6 C)  SpO2: 97%  Weight: 143 lb (64.9 kg)  Height: 5\' 11"  (1.803 m)     Physical Exam  Constitutional: He is oriented to person, place, and time. He appears well-developed and well-nourished.  HENT:  Head: Normocephalic and atraumatic.  Eyes: Pupils are equal, round, and reactive to light. No scleral icterus.  Cardiovascular: Normal rate, regular rhythm, normal heart sounds and intact distal pulses.  Pulmonary/Chest: Effort normal and breath sounds normal.  Abdominal: Soft. Normal appearance and bowel sounds are normal.  Neurological: He is oriented to person, place, and time.  Skin: Skin is warm and dry.  Psychiatric: He has a normal mood and affect. His  behavior is normal.        Assessment & Plan:      1. Essential hypertension  - Comprehensive metabolic panel - TSH  2. Flu vaccine need  - Flu vaccine HIGH DOSE PF (Fluzone High dose)  3. 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  5. Colon cancer screening   6. Abdominal pain of unknown cause Patient has had a complete work-up.  At this time will use October to try to go dairy free.  If no better after that in November he will go gluten-free.  We will see him back in December.  Consider treatment with Paxil on his next visit.  7. Nausea      I have done the exam and reviewed the above chart and it is accurate to the best of my knowledge. Development worker, community has been used in this note in any air is in the dictation or transcription are unintentional.   [name], MD  [place]

## 2017-12-27 DIAGNOSIS — E785 Hyperlipidemia, unspecified: Secondary | ICD-10-CM | POA: Diagnosis not present

## 2017-12-27 DIAGNOSIS — I10 Essential (primary) hypertension: Secondary | ICD-10-CM | POA: Diagnosis not present

## 2017-12-28 ENCOUNTER — Ambulatory Visit: Payer: Self-pay | Admitting: Family Medicine

## 2017-12-28 LAB — COMPREHENSIVE METABOLIC PANEL
ALK PHOS: 93 IU/L (ref 39–117)
ALT: 25 IU/L (ref 0–44)
AST: 20 IU/L (ref 0–40)
Albumin/Globulin Ratio: 1.8 (ref 1.2–2.2)
Albumin: 4.2 g/dL (ref 3.5–4.8)
BILIRUBIN TOTAL: 1.7 mg/dL — AB (ref 0.0–1.2)
BUN/Creatinine Ratio: 16 (ref 10–24)
BUN: 15 mg/dL (ref 8–27)
CHLORIDE: 105 mmol/L (ref 96–106)
CO2: 22 mmol/L (ref 20–29)
CREATININE: 0.96 mg/dL (ref 0.76–1.27)
Calcium: 8.9 mg/dL (ref 8.6–10.2)
GFR calc Af Amer: 89 mL/min/{1.73_m2} (ref >59)
GFR calc non Af Amer: 77 mL/min/{1.73_m2} (ref >59)
GLOBULIN, TOTAL: 2.4 g/dL (ref 1.5–4.5)
Glucose: 105 mg/dL — ABNORMAL HIGH (ref 65–99)
Potassium: 3.6 mmol/L (ref 3.5–5.2)
SODIUM: 144 mmol/L (ref 134–144)
Total Protein: 6.6 g/dL (ref 6.0–8.5)

## 2017-12-28 LAB — CBC WITH DIFFERENTIAL/PLATELET
BASOS ABS: 0 10*3/uL (ref 0.0–0.2)
Basos: 0 %
EOS (ABSOLUTE): 0.2 10*3/uL (ref 0.0–0.4)
Eos: 4 %
Hematocrit: 46.4 % (ref 37.5–51.0)
Hemoglobin: 16.5 g/dL (ref 13.0–17.7)
Immature Grans (Abs): 0 10*3/uL (ref 0.0–0.1)
Immature Granulocytes: 0 %
LYMPHS ABS: 1.6 10*3/uL (ref 0.7–3.1)
Lymphs: 27 %
MCH: 32.4 pg (ref 26.6–33.0)
MCHC: 35.6 g/dL (ref 31.5–35.7)
MCV: 91 fL (ref 79–97)
Monocytes Absolute: 0.5 10*3/uL (ref 0.1–0.9)
Monocytes: 9 %
Neutrophils Absolute: 3.4 10*3/uL (ref 1.4–7.0)
Neutrophils: 60 %
Platelets: 203 10*3/uL (ref 150–450)
RBC: 5.1 x10E6/uL (ref 4.14–5.80)
RDW: 13.4 % (ref 12.3–15.4)
WBC: 5.8 10*3/uL (ref 3.4–10.8)

## 2017-12-28 LAB — LIPID PANEL
CHOLESTEROL TOTAL: 125 mg/dL (ref 100–199)
Chol/HDL Ratio: 2.9 ratio (ref 0.0–5.0)
HDL: 43 mg/dL (ref >39)
LDL CALC: 65 mg/dL (ref 0–99)
TRIGLYCERIDES: 86 mg/dL (ref 0–149)
VLDL Cholesterol Cal: 17 mg/dL (ref 5–40)

## 2017-12-28 LAB — TSH: TSH: 2.86 u[IU]/mL (ref 0.450–4.500)

## 2018-02-10 ENCOUNTER — Other Ambulatory Visit: Payer: Self-pay

## 2018-03-07 ENCOUNTER — Telehealth: Payer: Self-pay

## 2018-03-07 NOTE — Telephone Encounter (Signed)
Patient called office requesting to speak with [name] about a form for his mother that had been faxed over to the office. Patient is wanting a call back this morning in regards to status of form. KW

## 2018-03-07 NOTE — Telephone Encounter (Signed)
Re-faxed form to medical supply store.

## 2018-03-13 ENCOUNTER — Ambulatory Visit: Payer: Self-pay | Admitting: Family Medicine

## 2018-04-28 IMAGING — CR DG RIBS 2V*R*
1 series · 5 of 5 positions shown · non-contrast
Comparison: None.

CLINICAL DATA: Fell down steps 10 days ago with persistent anterior
mid to upper right chest pain. History of esophageal cancer.

EXAM:
RIGHT RIBS - 2 VIEW

[Series 1: dg ribs unilateral right · 0.14mm/px · 5 of 5 slices shown]
[im 1/5]
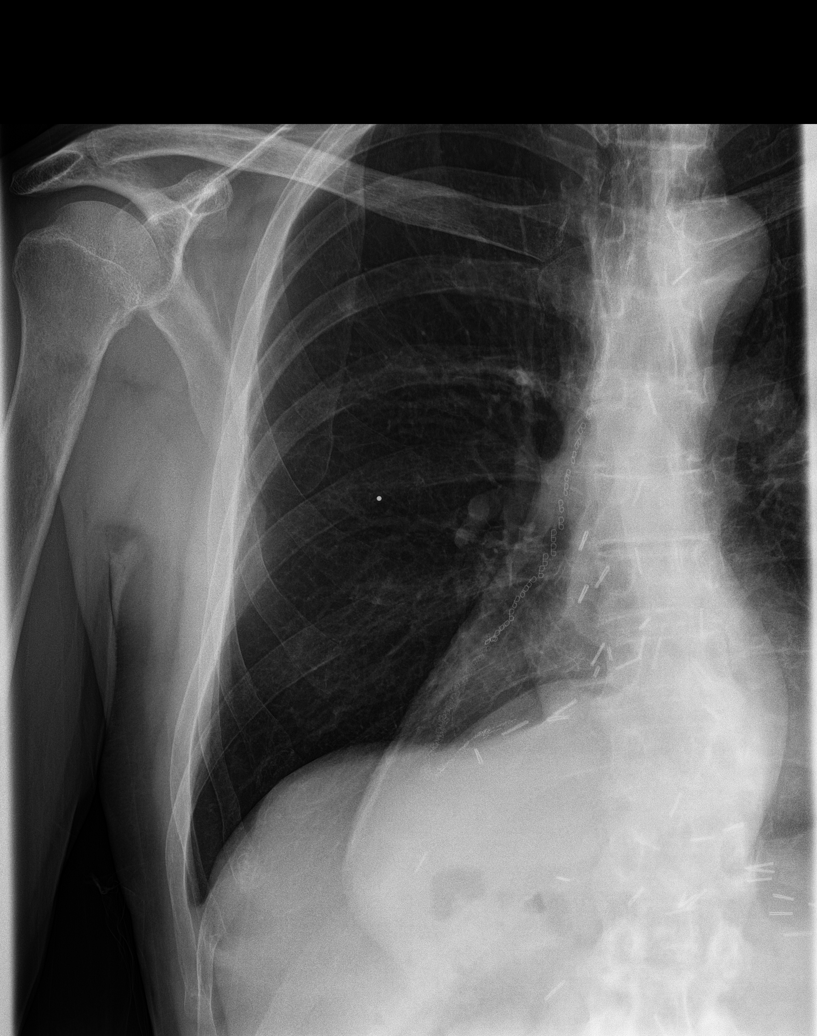
[im 2/5]
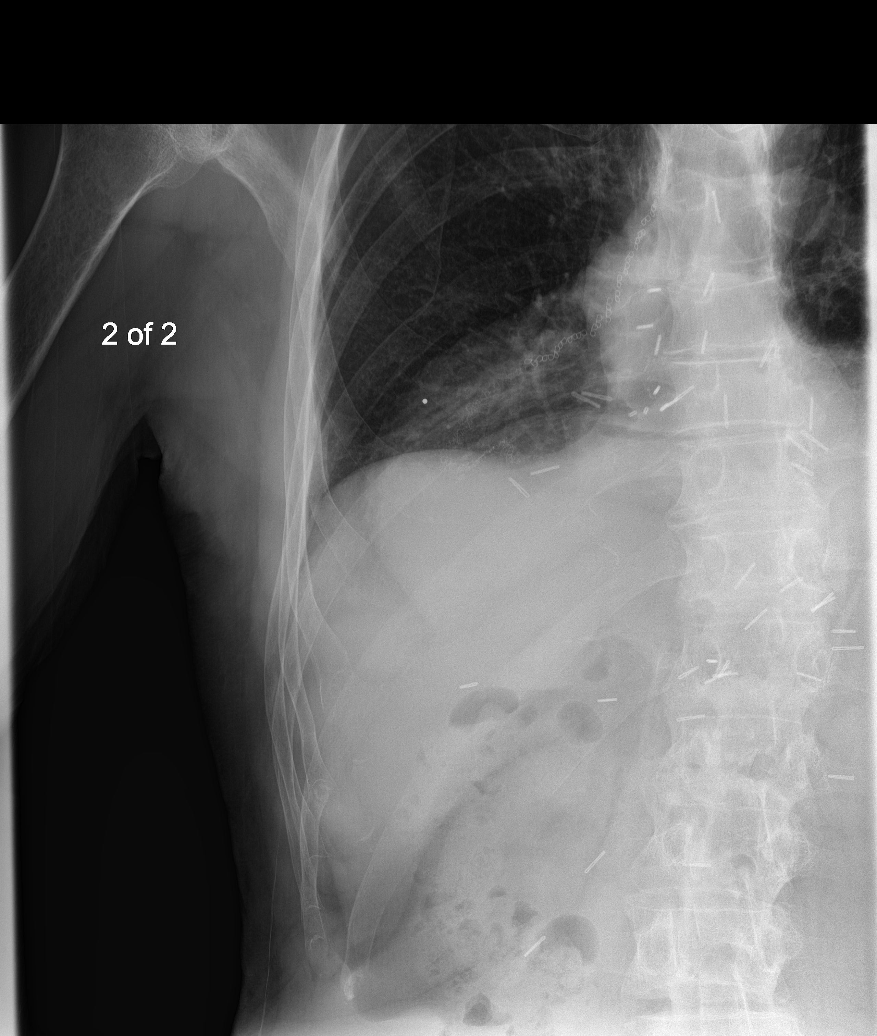
[im 3/5]
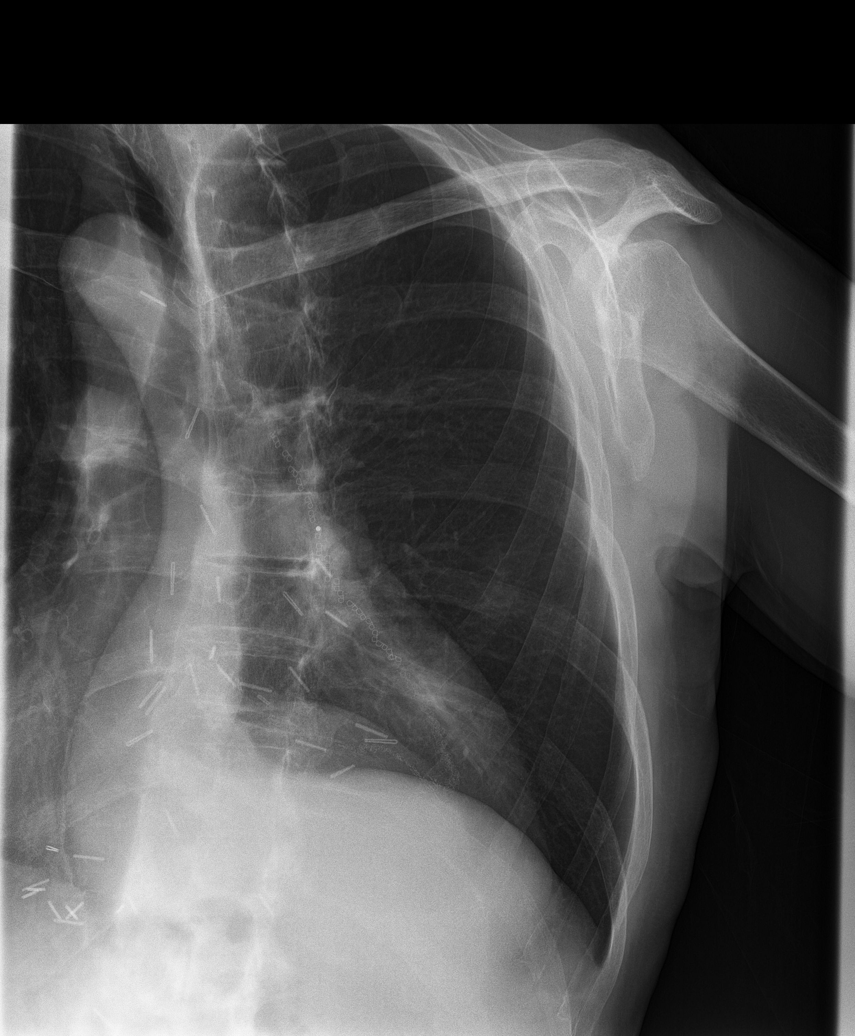
[im 4/5]
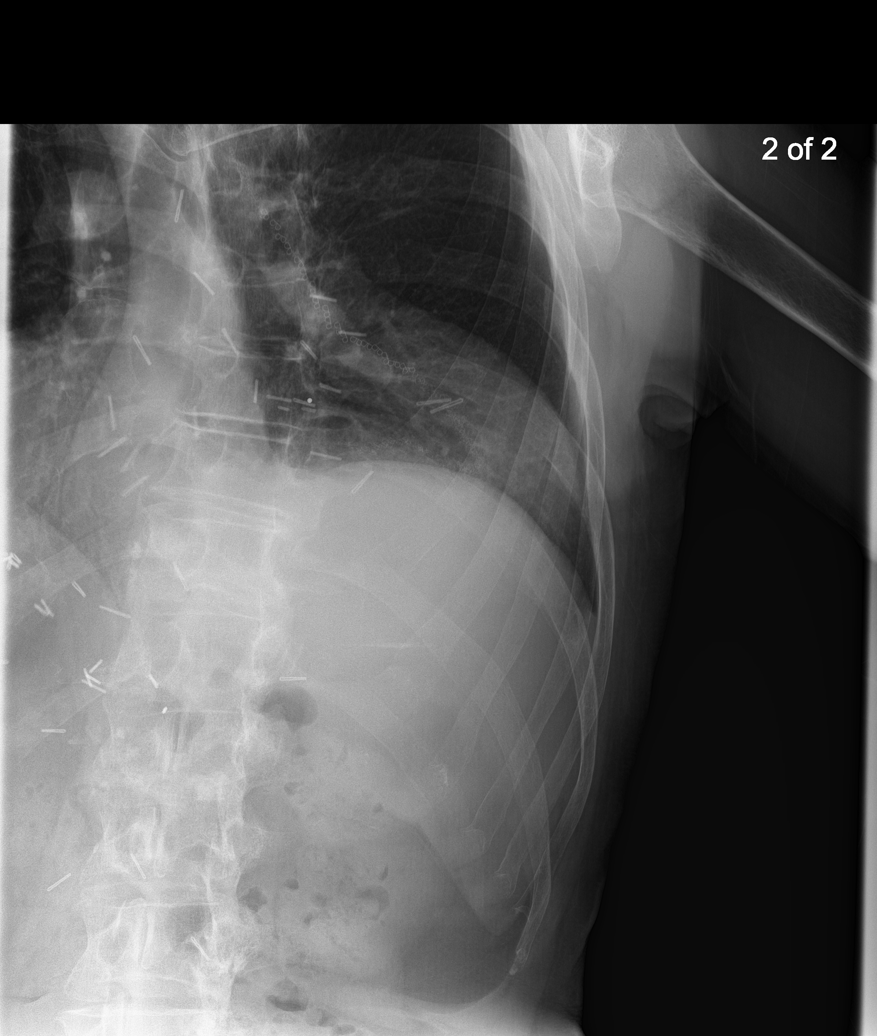
[im 5/5]
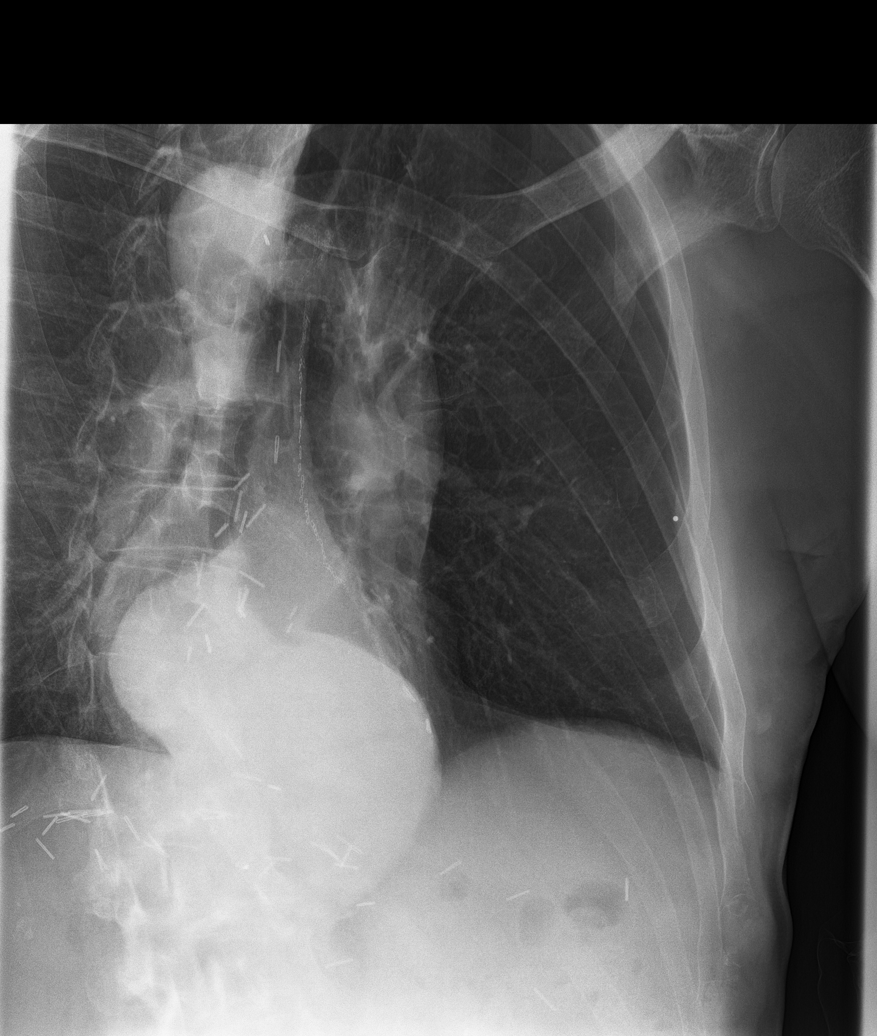

[5 of 5 positions shown; findings below may reference images not displayed]

FINDINGS: There is a minimally displaced acute to subacute fracture of the
right anterior fifth rib corresponding to the area patient's pain.
Postsurgical changes over the mediastinum likely esophagectomy and
gastric pull-through procedure compatible with known history of
esophageal cancer.
IMPRESSION: Minimally displaced acute fracture of the anterior right fifth rib.

## 2018-04-28 IMAGING — CR DG CHEST 2V
1 series · 2 of 2 positions shown · non-contrast
Comparison: None.

CLINICAL DATA: Fall down steps 10 days ago with persistent pain
right upper chest. History of esophageal cancer.

EXAM:
CHEST  2 VIEW

[Series 1: dg chest 2 view · 0.14mm/px · 2 of 2 slices shown]
[im 1/2]
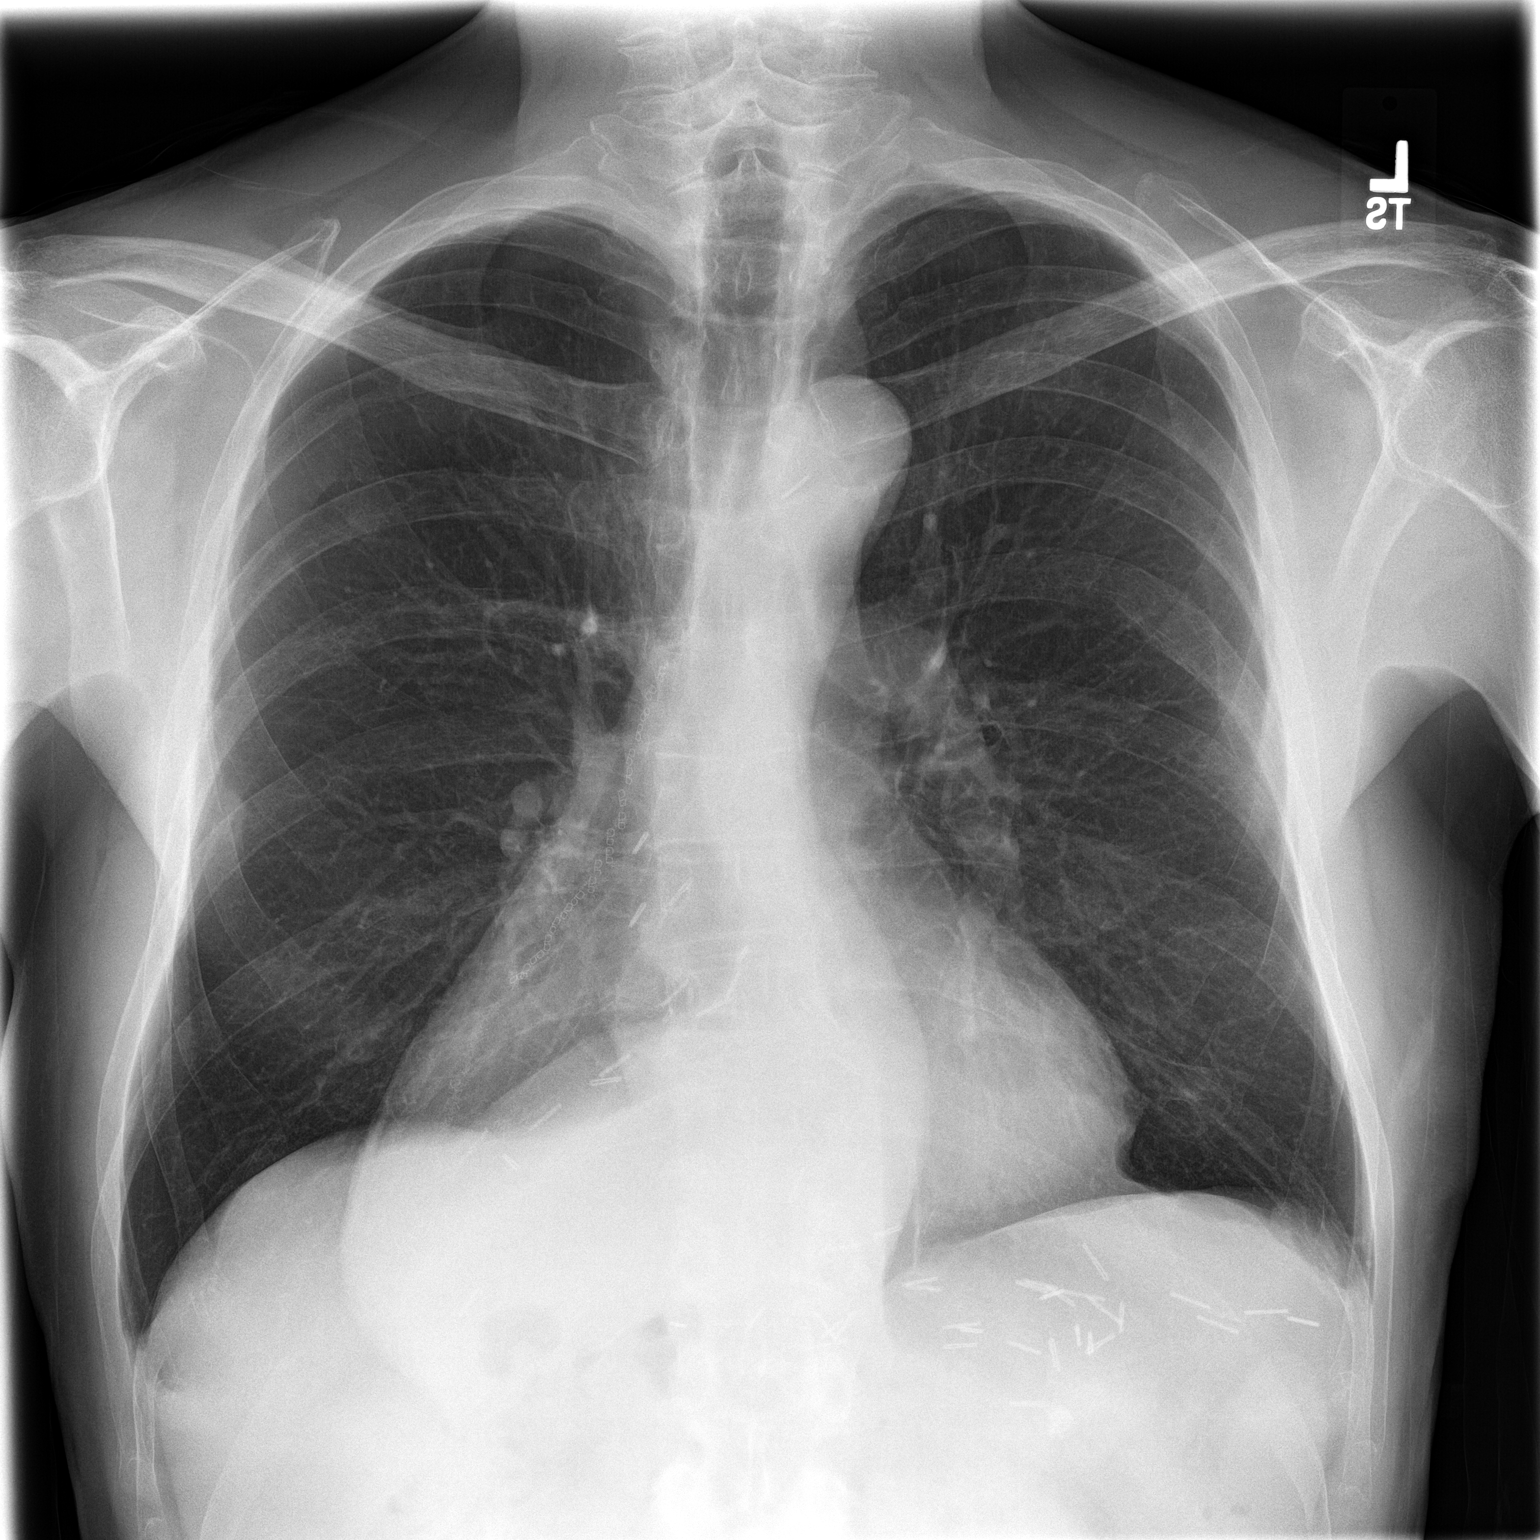
[im 2/2]
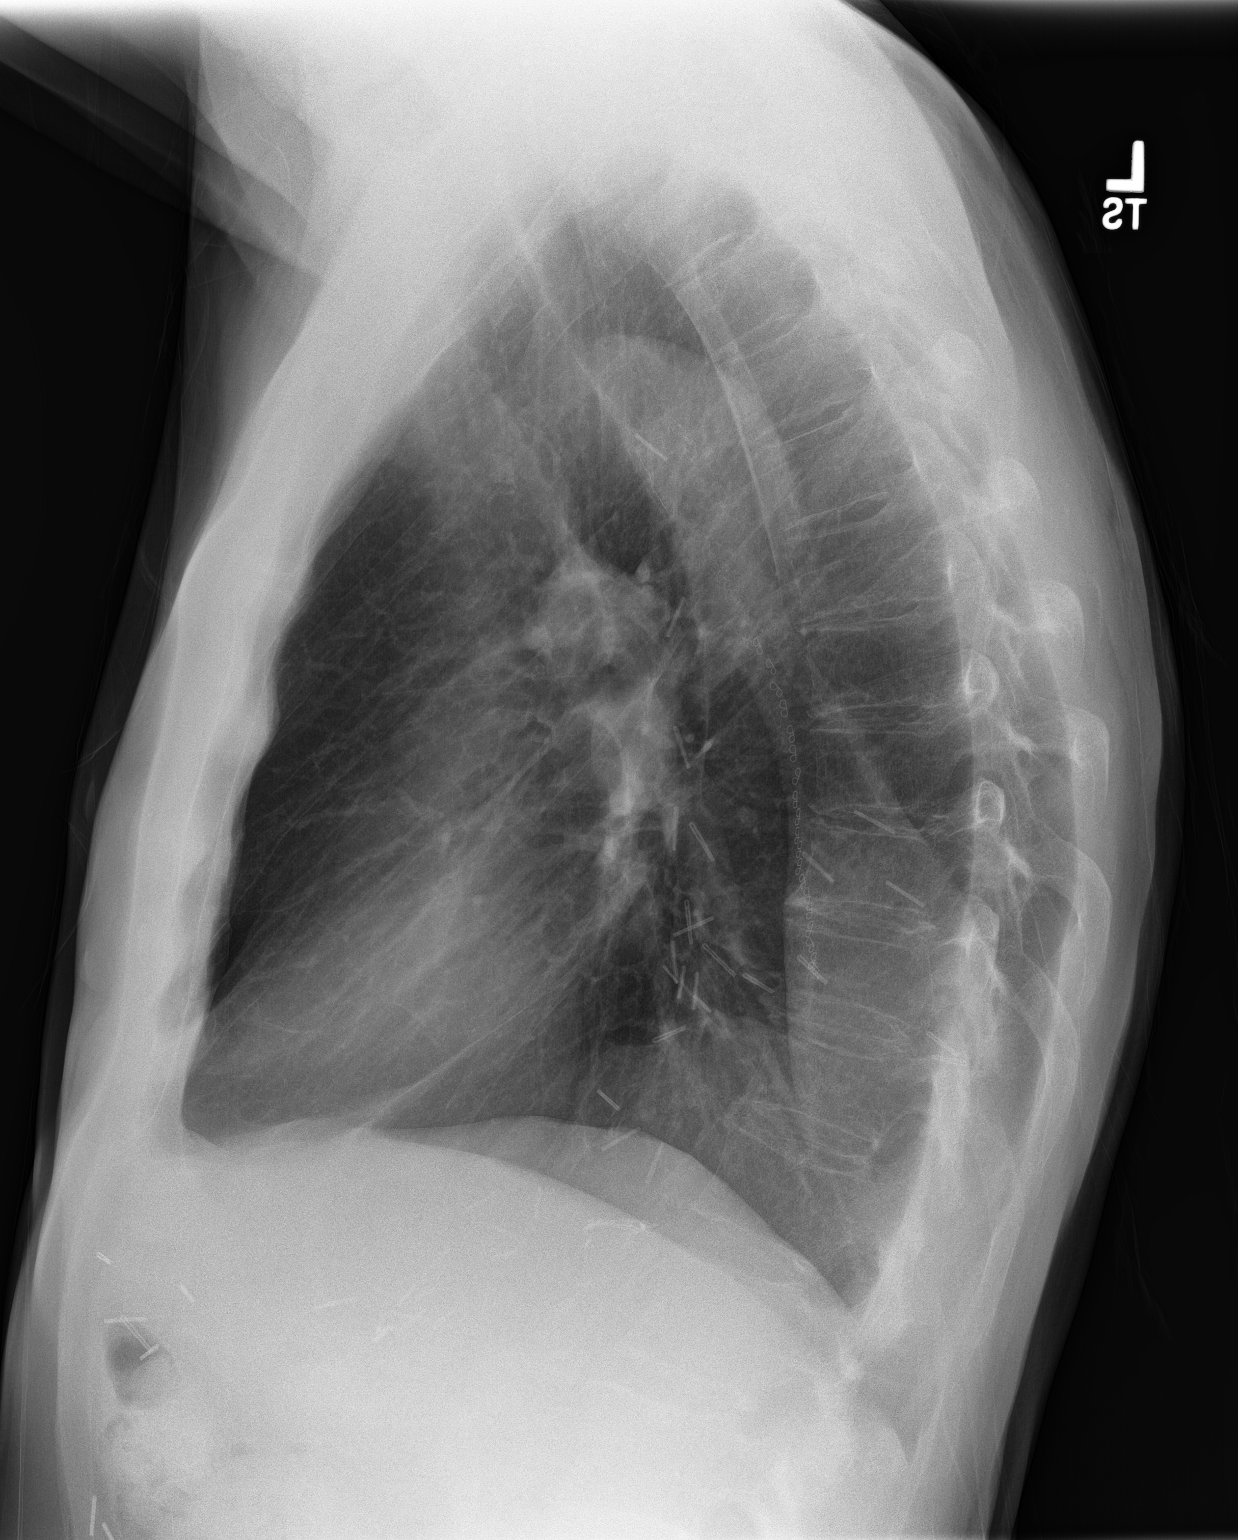

[2 of 2 positions shown; findings below may reference images not displayed]

FINDINGS: Lungs are adequately inflated and otherwise clear. There are
postsurgical changes likely due to prior esophagectomy and gastric
pull-through procedure. Cardiomediastinal silhouette is within
normal. Minimal degenerative change of the spine. There is a
minimally displaced anterior right fifth rib fracture likely acute
to subacute nature.
IMPRESSION: No acute cardiopulmonary disease.

Acute to subacute minimally displaced fracture of the right anterior
fifth rib.

## 2018-05-23 ENCOUNTER — Other Ambulatory Visit: Payer: Self-pay | Admitting: Family Medicine

## 2018-05-23 DIAGNOSIS — I10 Essential (primary) hypertension: Secondary | ICD-10-CM

## 2018-08-07 DIAGNOSIS — Z03818 Encounter for observation for suspected exposure to other biological agents ruled out: Secondary | ICD-10-CM | POA: Diagnosis not present

## 2018-11-17 DIAGNOSIS — H2513 Age-related nuclear cataract, bilateral: Secondary | ICD-10-CM | POA: Diagnosis not present

## 2018-11-24 ENCOUNTER — Other Ambulatory Visit: Payer: Self-pay | Admitting: Family Medicine

## 2018-11-24 DIAGNOSIS — I10 Essential (primary) hypertension: Secondary | ICD-10-CM

## 2019-01-11 DIAGNOSIS — Z23 Encounter for immunization: Secondary | ICD-10-CM | POA: Diagnosis not present

## 2019-02-09 ENCOUNTER — Other Ambulatory Visit: Payer: Self-pay | Admitting: Family Medicine

## 2019-02-09 DIAGNOSIS — I10 Essential (primary) hypertension: Secondary | ICD-10-CM

## 2019-02-21 ENCOUNTER — Other Ambulatory Visit: Payer: Self-pay

## 2019-05-16 ENCOUNTER — Other Ambulatory Visit: Payer: Self-pay | Admitting: Family Medicine

## 2019-05-16 DIAGNOSIS — I10 Essential (primary) hypertension: Secondary | ICD-10-CM

## 2019-06-13 ENCOUNTER — Ambulatory Visit: Payer: Self-pay | Admitting: *Deleted

## 2019-06-13 ENCOUNTER — Telehealth: Payer: Self-pay

## 2019-06-13 NOTE — Telephone Encounter (Signed)
Patient returned call and scheduled with Dr. Natale Milch for 06/15/2019

## 2019-06-13 NOTE — Telephone Encounter (Signed)
If it has to be this week it will need to be with [name].  I think [name] has plenty of openings Thursday and Friday

## 2019-06-13 NOTE — Telephone Encounter (Signed)
Pt disconnected the call before speaking with him.

## 2019-06-13 NOTE — Telephone Encounter (Signed)
Copied from [place] [id number]. Topic: Appointment Scheduling - Scheduling Inquiry for Clinic >> Jun 13, 2019 11:43 AM Oneta Rack wrote: Patient seeking an appointment with Dr. Rosanna Randy or Dr. Natale Milch only preferable today or this week, in office or virtual to discuss left side of chest muscle spasm for 1 week.

## 2019-06-13 NOTE — Telephone Encounter (Signed)
Called to advise patient that Dr. Rosanna Randy doesn't have any availability this week and that he can see Dr. Joana Reamer if he has to be seen this week, LMTCB about appointment.

## 2019-06-15 ENCOUNTER — Ambulatory Visit (INDEPENDENT_AMBULATORY_CARE_PROVIDER_SITE_OTHER): Payer: Medicare Other | Admitting: Family Medicine

## 2019-06-15 ENCOUNTER — Encounter: Payer: Self-pay | Admitting: Family Medicine

## 2019-06-15 ENCOUNTER — Other Ambulatory Visit: Payer: Self-pay

## 2019-06-15 VITALS — BP 136/70 | HR 65 | Temp 97.3°F | Resp 16 | Ht 69.0 in | Wt 146.0 lb

## 2019-06-15 DIAGNOSIS — M62838 Other muscle spasm: Secondary | ICD-10-CM

## 2019-06-15 NOTE — Progress Notes (Signed)
Patient: [name] Male    DOB: 1940/04/26   79 y.o.   MRN: [id number] Visit Date: 06/15/2019  Today's Provider: Vernie Murders, PA   Chief Complaint  Patient presents with  . Spasms   Subjective:     HPI Patient here today C/O pain on left side of trunk radiating to his back. Patient reports that pain has almost completely resolved. Patient reports pain was felt more with exhale. Patient denies any cough, shortness of breath or injuries. No fever or congestion. Continues to walk for 3 miles 5-6 days a week for exercise. Has had slight clear rhinorrhea occasionally.  Past Medical History:  Diagnosis Date  . Arthritis    wrist and ankle  . Cancer ([place])   . H/O type B viral hepatitis 1960  . History of esophageal cancer   . History of kidney stones   . Hypertension   . TMJ (temporomandibular joint syndrome)    has had a issure with this lately   Past Surgical History:  Procedure Laterality Date  . APPENDECTOMY     During college  . CHOLECYSTECTOMY  1970  . COLONOSCOPY WITH PROPOFOL N/A 09/22/2017   Procedure: COLONOSCOPY WITH PROPOFOL;  Surgeon: Lucilla Lame, MD;  Location: [facility];  Service: Endoscopy;  Laterality: N/A;  . ESOPHAGOGASTRODUODENOSCOPY (EGD) WITH PROPOFOL N/A 09/22/2017   Procedure: ESOPHAGOGASTRODUODENOSCOPY (EGD) WITH PROPOFOL;  Surgeon: Lucilla Lame, MD;  Location: [facility];  Service: Endoscopy;  Laterality: N/A;  . HERNIA REPAIR Left 1970  . POLYPECTOMY  09/22/2017   Procedure: POLYPECTOMY;  Surgeon: Lucilla Lame, MD;  Location: [facility];  Service: Endoscopy;;  . Resection of esophageal cancer  2003  . TONSILLECTOMY AND ADENOIDECTOMY     as a child  . VASECTOMY     Family History  Problem Relation Age of Onset  . Cancer Sister        Breast cancer   Patient Active Problem List   Diagnosis Date Noted  . Abdominal pain of unknown cause 12/26/2017  . Nausea 12/26/2017  . Epigastric pain   . Gastric  foreign body   . Colon cancer screening   . Benign neoplasm of ascending colon   . 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 Hernia, inguinal, right 12/30/2013  . Malignant neoplasm of gastrointestinal tract ([place]) 11/09/2011  . 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 Hypertension 08/03/2007   Allergies  Allergen Reactions  . Codeine Phosphate [Codeine] Other (See Comments) and Hives    Dizziness    Current Outpatient Medications:  .  amLODipine-benazepril (LOTREL) 5-20 MG capsule, TAKE 1 CAPSULE EVERY DAY, Disp: 90 capsule, Rfl: 3 .  metoprolol succinate (TOPROL-XL) 50 MG 24 hr tablet, TAKE 1 TABLET EVERY DAY, Disp: 30 tablet, Rfl: 0 .  Multiple Vitamins tablet, Take by mouth., Disp: , Rfl:  .  dicyclomine (BENTYL) 10 MG capsule, Take 1 capsule (10 mg total) by mouth 4 (four) times daily -  before meals and at bedtime., Disp: 120 capsule, Rfl: 3 .  hyoscyamine (LEVSIN SL) 0.125 MG SL tablet, Place 1 tablet (0.125 mg total) under the tongue every 4 (four) hours as needed. (Patient not taking: Reported on 09/13/2017), Disp: 30 tablet, Rfl: 0  Review of Systems  Constitutional: Negative.   Respiratory: Negative.   Cardiovascular: Negative.     Social History   Tobacco Use  . Smoking status: Former Smoker    Packs/day: 2.00    Years: 10.00  Pack years: 20.00    Quit date: 03/28/1972    Years since quitting: 47.2  . Smokeless tobacco: Never Used  Substance Use Topics  . Alcohol use: No      Objective:   BP 136/70 (BP Location: Right Arm, Patient Position: Sitting, Cuff Size: Normal)   Pulse 65   Temp (!) 97.3 F (36.3 C) (Temporal)   Resp 16   Ht 5\' 9"  (1.753 m)   Wt 146 lb (66.2 kg)   SpO2 97%   BMI 21.56 kg/m  Vitals:   06/15/19 1522  BP: 136/70  Pulse: 65  Resp: 16  Temp: (!) 97.3 F (36.3 C)  TempSrc: Temporal  SpO2: 97%  Weight: 146 lb (66.2 kg)  Height: 5\' 9"  (1.753 m)  Body mass index is 21.56 kg/m.   Physical Exam Constitutional:      General: He is  not in acute distress.    Appearance: He is well-developed.  HENT:     Head: Normocephalic and atraumatic.     Right Ear: Hearing normal.     Left Ear: Hearing normal.     Nose: Nose normal.  Eyes:     General: Lids are normal. No scleral icterus.       Right eye: No discharge.        Left eye: No discharge.     Conjunctiva/sclera: Conjunctivae normal.  Cardiovascular:     Rate and Rhythm: Normal rate and regular rhythm.     Heart sounds: Normal heart sounds.  Pulmonary:     Effort: Pulmonary effort is normal. No respiratory distress.     Breath sounds: Normal breath sounds.  Chest:     Chest wall: No tenderness.  Abdominal:     General: Bowel sounds are normal.     Palpations: Abdomen is soft.  Musculoskeletal:        General: Normal range of motion.     Cervical back: Normal range of motion.  Skin:    Findings: No lesion or rash.  Neurological:     Mental Status: He is alert and oriented to person, place, and time.  Psychiatric:        Speech: Speech normal.        Behavior: Behavior normal.        Thought Content: Thought content normal.       Assessment & Plan    1. Muscle spasm Had some discomfort in the left lower ribs to exhale fully over the past several days. No known injury or bruising. No fever, cough, sore throat or sputum production. Took one Aleve and feels the discomfort has fully resolved today. Suspect muscle spasm or chest wall muscle strain. May apply moist heat prn and recheck if recurrence.     Vernie Murders, PA  [facility]

## 2019-07-26 ENCOUNTER — Other Ambulatory Visit: Payer: Self-pay | Admitting: Family Medicine

## 2019-07-26 DIAGNOSIS — I10 Essential (primary) hypertension: Secondary | ICD-10-CM

## 2019-07-26 NOTE — Telephone Encounter (Signed)
Requested Prescriptions  Pending Prescriptions Disp Refills  . metoprolol succinate (TOPROL-XL) 50 MG 24 hr tablet [Pharmacy Med Name: METOPROLOL SUCCINATE ER 50 MG Tablet Extended Release 24 Hour] 30 tablet 0    Sig: TAKE 1 TABLET EVERY DAY     Cardiovascular:  Beta Blockers Passed - 07/26/2019  6:30 PM      Passed - Last BP in normal range    BP Readings from Last 1 Encounters:  06/15/19 136/70         Passed - Last Heart Rate in normal range    Pulse Readings from Last 1 Encounters:  06/15/19 65         Passed - Valid encounter within last 6 months    Recent Outpatient Visits          1 month ago Muscle spasm   [facility] [place], Vickki Muff, [place]   1 year ago Essential hypertension   [facility] Jerrol Banana., [place]   2 years ago Light headed   [place], Vickki Muff, [place]   2 years ago Hypoglycemia   [facility], Vickki Muff, [place]   3 years ago [place], [place], [place]

## 2019-08-20 ENCOUNTER — Other Ambulatory Visit: Payer: Self-pay | Admitting: Family Medicine

## 2019-08-20 DIAGNOSIS — I10 Essential (primary) hypertension: Secondary | ICD-10-CM

## 2019-09-10 ENCOUNTER — Other Ambulatory Visit: Payer: Self-pay | Admitting: Family Medicine

## 2019-09-10 DIAGNOSIS — I10 Essential (primary) hypertension: Secondary | ICD-10-CM

## 2019-10-25 DIAGNOSIS — Z20828 Contact with and (suspected) exposure to other viral communicable diseases: Secondary | ICD-10-CM | POA: Diagnosis not present

## 2019-11-09 ENCOUNTER — Other Ambulatory Visit: Payer: Self-pay | Admitting: Family Medicine

## 2019-11-09 DIAGNOSIS — I10 Essential (primary) hypertension: Secondary | ICD-10-CM

## 2019-11-23 DIAGNOSIS — H2513 Age-related nuclear cataract, bilateral: Secondary | ICD-10-CM | POA: Diagnosis not present

## 2019-11-29 DIAGNOSIS — Z23 Encounter for immunization: Secondary | ICD-10-CM | POA: Diagnosis not present

## 2020-01-14 ENCOUNTER — Other Ambulatory Visit: Payer: Self-pay | Admitting: Family Medicine

## 2020-01-14 DIAGNOSIS — I10 Essential (primary) hypertension: Secondary | ICD-10-CM

## 2020-01-14 NOTE — Telephone Encounter (Signed)
Requested medications are due for refill today?  Yes   Requested medications are on active medication list?  Yes  Last Refill:  02/09/2019 # 90 with 3 refills   Future visit scheduled? No   Notes to Clinic:  Medication failed Rx refill protocol due to no valid encounter in the past 6 months and no labs in the past 180 days.  Last labs performed on 12/27/2017.

## 2020-01-14 NOTE — Telephone Encounter (Signed)
Requested Prescriptions  Pending Prescriptions Disp Refills   amLODipine-benazepril (LOTREL) 5-20 MG capsule [Pharmacy Med Name: AMLODIPINE BESYLATE/BENAZEPRIL HYDROCHLORIDE 5-20 MG Capsule] 90 capsule 3    Sig: TAKE 1 CAPSULE EVERY DAY     Cardiovascular: CCB + ACEI Combos Failed - 01/14/2020  6:05 PM      Failed - Cr in normal range and within 180 days    Creatinine, Ser  Date Value Ref Range Status  12/27/2017 0.96 0.76 - 1.27 mg/dL Final         Failed - K in normal range and within 180 days    Potassium  Date Value Ref Range Status  12/27/2017 3.6 3.5 - 5.2 mmol/L Final         Failed - Valid encounter within last 6 months    Recent Outpatient Visits          7 months ago Muscle spasm   [place], Vickki Muff, [place]   2 years ago Essential hypertension   [facility] Jerrol Banana., [place]   3 years ago Light headed   [place], [place]   3 years ago Hypoglycemia   [facility], [place], [place]   3 years ago [place], [place], [place] - Patient is not pregnant      Passed - Last BP in normal range    BP Readings from Last 1 Encounters:  06/15/19 136/70          metoprolol succinate (TOPROL-XL) 50 MG 24 hr tablet [Pharmacy Med Name: METOPROLOL SUCCINATE ER 50 MG Tablet Extended Release 24 Hour] 30 tablet 0    Sig: TAKE 1 TABLET EVERY DAY     Cardiovascular:  Beta Blockers Failed - 01/14/2020  6:05 PM      Failed - Valid encounter within last 6 months    Recent Outpatient Visits          7 months ago Muscle spasm   [place], Vickki Muff, [place]   2 years ago Essential hypertension   [facility] Jerrol Banana., MD   3 years ago Light headed   [facility], [place], [place]   3 years ago Hypoglycemia   [facility], [place], [place]   3 years ago [place], [place], [place]             Passed - Last BP in normal range    BP Readings from Last 1 Encounters:  06/15/19 136/70         Passed - Last Heart Rate in normal range    Pulse Readings from Last 1 Encounters:  06/15/19 65         One month courtesy refill with reminder for patient to schedule an appointment for an office visit.

## 2020-01-29 DIAGNOSIS — Z23 Encounter for immunization: Secondary | ICD-10-CM | POA: Diagnosis not present

## 2020-02-05 ENCOUNTER — Telehealth: Payer: Self-pay | Admitting: Family Medicine

## 2020-02-05 NOTE — Telephone Encounter (Signed)
Copied from [place] [phone]. Topic: Medicare AWV >> Feb 05, 2020 10:24 AM Cher Nakai R wrote: Reason for CRM:  Left message for patient to call back and schedule Medicare Annual Wellness Visit (AWV) either virtually or in office.  No hx of AWV - AWV-I eligible as of 05/27/2013  Please schedule at anytime with [facility] Health Advisor.   40 minute appointment  Any questions, please contact me at [phone]

## 2020-04-16 ENCOUNTER — Other Ambulatory Visit: Payer: Self-pay | Admitting: Family Medicine

## 2020-04-16 DIAGNOSIS — I10 Essential (primary) hypertension: Secondary | ICD-10-CM

## 2020-04-18 ENCOUNTER — Telehealth: Payer: Self-pay

## 2020-04-18 NOTE — Telephone Encounter (Signed)
FYI

## 2020-04-18 NOTE — Telephone Encounter (Signed)
FYI. KW 

## 2020-04-18 NOTE — Telephone Encounter (Signed)
Copied from [place] [phone]. Topic: Appointment Scheduling - Scheduling Inquiry for Clinic >> Apr 18, 2020 11:17 AM Erick Blinks wrote: Reason for CRM: Pt called and does not feel comfortable coming in to the office, scheduled a Mychart video visit for 6 month follow up.

## 2020-04-22 ENCOUNTER — Telehealth: Payer: Medicare Other | Admitting: Family Medicine

## 2020-04-22 DIAGNOSIS — H903 Sensorineural hearing loss, bilateral: Secondary | ICD-10-CM | POA: Diagnosis not present

## 2020-04-22 DIAGNOSIS — H9313 Tinnitus, bilateral: Secondary | ICD-10-CM | POA: Diagnosis not present

## 2020-04-23 ENCOUNTER — Telehealth (INDEPENDENT_AMBULATORY_CARE_PROVIDER_SITE_OTHER): Payer: Medicare Other | Admitting: Family Medicine

## 2020-04-23 VITALS — BP 130/75

## 2020-04-23 DIAGNOSIS — I10 Essential (primary) hypertension: Secondary | ICD-10-CM | POA: Diagnosis not present

## 2020-04-23 DIAGNOSIS — D122 Benign neoplasm of ascending colon: Secondary | ICD-10-CM

## 2020-04-23 MED ORDER — METOPROLOL SUCCINATE ER 50 MG PO TB24: 50.0000 mg | ORAL_TABLET | Freq: Every day | ORAL | 1 refills | Status: DC

## 2020-04-23 NOTE — Progress Notes (Signed)
MyChart Video Visit    Virtual Visit via Video Note   This visit type was conducted due to national recommendations for restrictions regarding the COVID-19 Pandemic (e.g. social distancing) in an effort to limit this patient's exposure and mitigate transmission in our community. This patient is at least at moderate risk for complications without adequate follow up. This format is felt to be most appropriate for this patient at this time. Physical exam was limited by quality of the video and audio technology used for the visit.   Patient location: Home Provider location: Office  I discussed the limitations of evaluation and management by telemedicine and the availability of in person appointments. The patient expressed understanding and agreed to proceed.  Patient: [name] [id number]   DOB: January 27, 1941   80 y.o. Male  MRN: [id number] Visit Date: 04/23/2020  Today's healthcare provider: [name], MD   Chief Complaint  Patient presents with  . Hypertension   Subjective    HPI  Patient is a 80 year old who walks 3 miles per day.  He feels very well and has no complaints. 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 Hypertension, follow-up  BP Readings from Last 3 Encounters:  06/15/19 136/70  12/26/17 138/76  12/05/17 139/75   Wt Readings from Last 3 Encounters:  06/15/19 146 lb (66.2 kg)  12/26/17 143 lb (64.9 kg)  12/05/17 145 lb (65.8 kg)     He was last seen for hypertension 4 months ago.  Management since that visit includes No changes.  He reports excellent compliance with treatment. He is not having side effects.  He is following a Low Sodium diet. He is exercising. He does not smoke.  Use of agents associated with hypertension: none.   Outside blood pressures are 130's/70's. Symptoms: No chest pain No chest pressure  No palpitations No syncope  No dyspnea No orthopnea  No paroxysmal nocturnal dyspnea No lower extremity edema    Pertinent labs: Lab Results  Component Value Date   CHOL 125 12/27/2017   HDL 43 12/27/2017   LDLCALC 65 12/27/2017   TRIG 86 12/27/2017   CHOLHDL 2.9 12/27/2017   Lab Results  Component Value Date   NA 144 12/27/2017   K 3.6 12/27/2017   CREATININE 0.96 12/27/2017   GFRNONAA 77 12/27/2017   GFRAA 89 12/27/2017   GLUCOSE 105 (H) 12/27/2017     The ASCVD Risk score (Goff DC Jr., et al., 2013) failed to calculate for the following reasons:   The valid total cholesterol range is 130 to 320 mg/dL   ---------------------------------------------------------------------------------------------------   Patient Active Problem List   Diagnosis Date Noted  . Abdominal pain of unknown cause 12/26/2017  . Nausea 12/26/2017  . Epigastric pain   . Gastric foreign body   . Colon cancer screening   . Benign neoplasm of ascending colon   . 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 Hernia, inguinal, right 12/30/2013  . Malignant neoplasm of gastrointestinal tract ([place]) 11/09/2011  . 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 Hypertension 08/03/2007   Past Medical History:  Diagnosis Date  . Arthritis    wrist and ankle  . Cancer ([place])   . H/O type B viral hepatitis 1960  . History of esophageal cancer   . History of kidney stones   . Hypertension   . TMJ (temporomandibular joint syndrome)    has had a issure with this lately   Social History  Tobacco Use  . Smoking status: Former Smoker    Packs/day: 2.00    Years: 10.00    Pack years: 20.00    Quit date: 03/28/1972    Years since quitting: 48.1  . Smokeless tobacco: Never Used  Substance Use Topics  . Alcohol use: No  . Drug use: No   Allergies  Allergen Reactions  . Codeine Phosphate [Codeine] Other (See Comments) and Hives    Dizziness    Medications: Outpatient Medications Prior to Visit  Medication Sig  . amLODipine-benazepril (LOTREL) 5-20 MG capsule TAKE 1 CAPSULE EVERY DAY  . metoprolol succinate (TOPROL-XL)  50 MG 24 hr tablet TAKE 1 TABLET EVERY DAY  . Multiple Vitamins tablet Take by mouth.   No facility-administered medications prior to visit.    Review of Systems    Objective    There were no vitals taken for this visit.   Physical Exam  Patient is alert and oriented.  He feels well.   Assessment & Plan     1. Essential hypertension Patient will need follow-up in the following complete lab work then. - metoprolol succinate (TOPROL-XL) 50 MG 24 hr tablet; Take 1 tablet (50 mg total) by mouth daily. Take with or immediately following a meal.  Dispense: 90 tablet; Refill: 1  2. Benign neoplasm of ascending colon GI told him he needs no further follow-up on this. Colonoscopy in 2019  No follow-ups on file.     I discussed the assessment and treatment plan with the patient. The patient was provided an opportunity to ask questions and all were answered. The patient agreed with the plan and demonstrated an understanding of the instructions.   The patient was advised to call back or seek an in-person evaluation if the symptoms worsen or if the condition fails to improve as anticipated.  I provided 15 minutes of non-face-to-face time during this encounter.    Clint Biello Cranford Mon, MD [facility] [phone] (phone) [phone] (fax)  [place]

## 2020-05-12 ENCOUNTER — Other Ambulatory Visit: Payer: Self-pay | Admitting: Family Medicine

## 2020-05-12 DIAGNOSIS — I10 Essential (primary) hypertension: Secondary | ICD-10-CM

## 2020-05-13 DIAGNOSIS — Z20822 Contact with and (suspected) exposure to covid-19: Secondary | ICD-10-CM | POA: Diagnosis not present

## 2020-06-27 DIAGNOSIS — Z23 Encounter for immunization: Secondary | ICD-10-CM | POA: Diagnosis not present

## 2020-10-07 ENCOUNTER — Other Ambulatory Visit: Payer: Self-pay | Admitting: Family Medicine

## 2020-10-07 DIAGNOSIS — I10 Essential (primary) hypertension: Secondary | ICD-10-CM

## 2020-10-20 DIAGNOSIS — H903 Sensorineural hearing loss, bilateral: Secondary | ICD-10-CM | POA: Diagnosis not present

## 2020-12-02 ENCOUNTER — Other Ambulatory Visit: Payer: Self-pay | Admitting: Family Medicine

## 2020-12-02 DIAGNOSIS — I10 Essential (primary) hypertension: Secondary | ICD-10-CM

## 2020-12-08 DIAGNOSIS — T1511XA Foreign body in conjunctival sac, right eye, initial encounter: Secondary | ICD-10-CM | POA: Diagnosis not present

## 2020-12-09 DIAGNOSIS — Z23 Encounter for immunization: Secondary | ICD-10-CM | POA: Diagnosis not present

## 2021-03-18 ENCOUNTER — Other Ambulatory Visit: Payer: Self-pay

## 2021-03-18 ENCOUNTER — Encounter: Payer: Self-pay | Admitting: Emergency Medicine

## 2021-03-18 ENCOUNTER — Ambulatory Visit
Admission: EM | Admit: 2021-03-18 | Discharge: 2021-03-18 | Disposition: A | Discharge status: Home / Self Care | Payer: Medicare Other | Attending: Medical Oncology | Admitting: Medical Oncology

## 2021-03-18 DIAGNOSIS — B349 Viral infection, unspecified: Secondary | ICD-10-CM | POA: Diagnosis not present

## 2021-03-18 MED ORDER — BENZONATATE 100 MG PO CAPS: 100.0000 mg | ORAL_CAPSULE | Freq: Three times a day (TID) | ORAL | 0 refills | Status: DC

## 2021-03-18 MED ORDER — FLUTICASONE PROPIONATE 50 MCG/ACT NA SUSP: 2.0000 | Freq: Every day | NASAL | 0 refills | Status: DC

## 2021-03-18 NOTE — ED Provider Notes (Signed)
[name]    CSN: [id number] Arrival date & time: 03/18/21  0825      History   Chief Complaint Chief Complaint  Patient presents with   Nasal Congestion   Sore Throat   Headache    HPI [name] is a 80 y.o. male.   HPI  Cold Symptoms: Patient reports that they have had symptoms of cough, congestion, headache for the past 2 days. Symptoms are stable. They deny SOB, chest pain, fever or vomiting. They have tried advil for symptoms with slight relief. No known sick contacts.    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   Esophageal stenosis 11/09/2011   Hypertension 08/03/2007    Past Surgical History:  Procedure Laterality Date   APPENDECTOMY     During college   CHOLECYSTECTOMY  1970   COLONOSCOPY WITH PROPOFOL N/A 09/22/2017   Procedure: COLONOSCOPY WITH PROPOFOL;  Surgeon: Lucilla Lame, MD;  Location: [facility];  Service: Endoscopy;  Laterality: N/A;   ESOPHAGOGASTRODUODENOSCOPY (EGD) WITH PROPOFOL N/A 09/22/2017   Procedure: ESOPHAGOGASTRODUODENOSCOPY (EGD) WITH PROPOFOL;  Surgeon: Lucilla Lame, MD;  Location: [facility];  Service: Endoscopy;  Laterality: N/A;   HERNIA REPAIR Left 1970   POLYPECTOMY  09/22/2017   Procedure: POLYPECTOMY;  Surgeon: Lucilla Lame, MD;  Location: [place];  Service: Endoscopy;;    Resection of esophageal cancer  2003   TONSILLECTOMY AND ADENOIDECTOMY     as a child   VASECTOMY         Home Medications    Prior to Admission medications   Medication Sig Start Date End Date Taking? Authorizing Provider  amLODipine-benazepril (LOTREL) 5-20 MG capsule TAKE 1 CAPSULE EVERY DAY (NEED MD APPOINTMENT FOR REFILLS) 12/02/20   Jerrol Banana., MD  metoprolol succinate (TOPROL-XL) 50 MG 24 hr tablet TAKE 1 TABLET EVERY DAY, TAKE WITH OR IMMEDIATELY FOLLOWING A MEAL (NEED MD APPOINTMENT) 10/07/20   Jerrol Banana., MD  Multiple Vitamins tablet Take by mouth.    [provider]    Family History Family History  Problem Relation Age of Onset   Cancer Sister        Breast cancer    Social History Social History   Tobacco Use   Smoking status: Former    Packs/day: 2.00    Years: 10.00    Pack years: 20.00    Types: Cigarettes    Quit date: 03/28/1972    Years since quitting: 49.0   Smokeless tobacco: Never  Substance Use Topics   Alcohol use: No   Drug use: No     Allergies   Codeine phosphate [codeine]   Review of Systems Review of Systems  As stated above in HPI Physical Exam  Triage Vital Signs ED Triage Vitals  Enc Vitals Group     BP 03/18/21 0835 137/78     Pulse Rate 03/18/21 0835 96     Resp 03/18/21 0835 18     Temp 03/18/21 0835 98.6 F (37 C)     Temp Source 03/18/21 0835 Oral     SpO2 03/18/21 0835 96 %     Weight --      Height --      Head Circumference --      Peak Flow --      Pain Score 03/18/21 0842 2     Pain Loc --      Pain Edu? --      Excl. in [place]? --    No data found.  Updated Vital Signs BP 137/78 (BP Location: Left Arm)    Pulse 96    Temp 98.6 F (37 C) (Oral)    Resp 18    SpO2 96%   Physical Exam Vitals and nursing note reviewed.  Constitutional:      General: He is not in acute distress.    Appearance: Normal appearance. He is not ill-appearing, toxic-appearing or diaphoretic.   HENT:     Head: Normocephalic and atraumatic.     Right Ear: Tympanic membrane normal.     Left Ear: Tympanic membrane normal.     Nose: Congestion and rhinorrhea (mild) present.     Mouth/Throat:     Mouth: Mucous membranes are moist.     Pharynx: Oropharynx is clear. No oropharyngeal exudate or posterior oropharyngeal erythema.     Comments: White/clear post nasal drainage Eyes:     Extraocular Movements: Extraocular movements intact.     Conjunctiva/sclera: Conjunctivae normal.     Pupils: Pupils are equal, round, and reactive to light.  Cardiovascular:     Rate and Rhythm: Normal rate and regular rhythm.     Heart sounds: Normal heart sounds.  Pulmonary:     Effort: Pulmonary effort is normal.     Breath sounds: Normal breath sounds.  Musculoskeletal:     Cervical back: Normal range of motion and neck supple.  Lymphadenopathy:     Cervical: No cervical adenopathy.  Skin:    General: Skin is warm.  Neurological:     Mental Status: He is alert and oriented to person, place, and time.     UC Treatments / Results  Labs (all labs ordered are listed, but only abnormal results are displayed) Labs Reviewed  NOVEL CORONAVIRUS, NAA  POCT INFLUENZA A/B    EKG   Radiology No results found.  Procedures Procedures (including critical care time)  Medications Ordered in UC Medications - No data to display  Initial Impression / Assessment and Plan / UC Course  I have reviewed the triage vital signs and the nursing notes.  Pertinent labs & imaging results that were available during my care of the patient were reviewed by me and considered in my medical decision making (see chart for details).     New.  Influenza testing in the office is negative.  COVID testing pending.  This appears viral in nature which I discussed with patient, along with typical timeframe of contagiousness and timing for resolution of symptoms.  Rest and hydration encouraged along with Tessalon and  Flonase to be sent to the pharmacy.  Red flag signs and symptoms discussed. Final Clinical Impressions(s) / UC Diagnoses   Final diagnoses:  Viral illness   Discharge Instructions   None  ED Prescriptions   None    PDMP not reviewed this encounter.   Hughie Closs, [place] 03/18/21 [phone]

## 2021-03-18 NOTE — ED Triage Notes (Addendum)
Pt here with sore throat, nasal congestion and headache x 2 days. Pt tested negative on 2 home covid tests.

## 2021-03-19 ENCOUNTER — Ambulatory Visit: Payer: Self-pay

## 2021-03-19 ENCOUNTER — Other Ambulatory Visit: Payer: Self-pay | Admitting: Family Medicine

## 2021-03-19 LAB — SARS-COV-2, NAA 2 DAY TAT

## 2021-03-19 LAB — NOVEL CORONAVIRUS, NAA: SARS-CoV-2, NAA: DETECTED — AB

## 2021-03-19 MED ORDER — NIRMATRELVIR/RITONAVIR (PAXLOVID)TABLET: 3.0000 | ORAL_TABLET | Freq: Two times a day (BID) | ORAL | 0 refills | Status: AC

## 2021-03-19 NOTE — Telephone Encounter (Signed)
Patient advised. kw

## 2021-03-19 NOTE — Telephone Encounter (Signed)
The patient has tested positive for COVID 19   The patient would like to be prescribed something to help with their symptoms   The patient is currently experiencing an elevated temperature as well as a cough and runny nose    Chief Complaint: COVID positive Symptoms: Cough, runny nose, headache, tired, low grade fever, body aches Frequency: Started Monday Pertinent Negatives: Patient denies shortness of breath Disposition: [] ED /[] Urgent Care (no appt availability in office) / [] Appointment(In office/virtual)/ []  [facility]/ [] Home Care/ [] Refused Recommended Disposition  Additional Notes: Seen in UC yesterday. Asking for anti-viral. Please advise pt.   Answer Assessment - Initial Assessment Questions 1. COVID-19 DIAGNOSIS: "Who made your COVID-19 diagnosis?" "Was it confirmed by a positive lab test or self-test?" If not diagnosed by a doctor (or NP/PA), ask "Are there lots of cases (community spread) where you live?" Note: See public health department website, if unsure.     UC 2. COVID-19 EXPOSURE: "Was there any known exposure to COVID before the symptoms began?" CDC Definition of close contact: within 6 feet (2 meters) for a total of 15 minutes or more over a 24-hour period.      nO 3. ONSET: "When did the COVID-19 symptoms start?"      Monday 4. WORST SYMPTOM: "What is your worst symptom?" (e.g., cough, fever, shortness of breath, muscle aches)     Cough 5. COUGH: "Do you have a cough?" If Yes, ask: "How bad is the cough?"       Yes 6. FEVER: "Do you have a fever?" If Yes, ask: "What is your temperature, how was it measured, and when did it start?"     Low grade 7. RESPIRATORY STATUS: "Describe your breathing?" (e.g., shortness of breath, wheezing, unable to speak)      No 8. BETTER-SAME-WORSE: "Are you getting better, staying the same or getting worse compared to yesterday?"  If getting worse, ask, "In what way?"     Same 9. HIGH RISK DISEASE: "Do you have any  chronic medical problems?" (e.g., asthma, heart or lung disease, weak immune system, obesity, etc.)     No 10. VACCINE: "Have you had the COVID-19 vaccine?" If Yes, ask: "Which one, how many shots, when did you get it?"       N/a 11. BOOSTER: "Have you received your COVID-19 booster?" If Yes, ask: "Which one and when did you get it?"       N/a 12. PREGNANCY: "Is there any chance you are pregnant?" "When was your last menstrual period?"       N/a 13. OTHER SYMPTOMS: "Do you have any other symptoms?"  (e.g., chills, fatigue, headache, loss of smell or taste, muscle pain, sore throat)       Headache, sore throat, runny nose, tired, body aches 14. O2 SATURATION MONITOR:  "Do you use an oxygen saturation monitor (pulse oximeter) at home?" If Yes, ask "What is your reading (oxygen level) today?" "What is your usual oxygen saturation reading?" (e.g., 95%)       No  Protocols used: Coronavirus (COVID-19) Diagnosed or Suspected-A-AH

## 2021-03-19 NOTE — Telephone Encounter (Signed)
Please review for Dr. Gilbert  Thanks,   -Coner Gibbard  

## 2021-04-21 ENCOUNTER — Other Ambulatory Visit: Payer: Self-pay

## 2021-04-21 ENCOUNTER — Telehealth: Payer: Self-pay | Admitting: Family Medicine

## 2021-04-21 DIAGNOSIS — I10 Essential (primary) hypertension: Secondary | ICD-10-CM

## 2021-04-21 MED ORDER — METOPROLOL SUCCINATE ER 50 MG PO TB24: ORAL_TABLET | ORAL | 0 refills | Status: DC

## 2021-04-21 NOTE — Telephone Encounter (Signed)
[facility] faxed refill request for the following medications:  metoprolol succinate (TOPROL-XL) 50 MG 24 hr tablet   Please advise.

## 2021-04-23 ENCOUNTER — Telehealth: Payer: Self-pay | Admitting: Family Medicine

## 2021-04-23 DIAGNOSIS — I10 Essential (primary) hypertension: Secondary | ICD-10-CM

## 2021-04-23 MED ORDER — AMLODIPINE BESY-BENAZEPRIL HCL 5-20 MG PO CAPS: ORAL_CAPSULE | ORAL | 0 refills | Status: DC

## 2021-04-23 NOTE — Telephone Encounter (Signed)
[facility] faxed refill request for the following medications:  amLODipine-benazepril (LOTREL) 5-20 MG capsule   Please advise.

## 2021-05-11 ENCOUNTER — Other Ambulatory Visit: Payer: Self-pay | Admitting: Family Medicine

## 2021-05-11 DIAGNOSIS — I10 Essential (primary) hypertension: Secondary | ICD-10-CM

## 2021-05-13 ENCOUNTER — Other Ambulatory Visit: Payer: Self-pay | Admitting: Family Medicine

## 2021-05-13 DIAGNOSIS — I10 Essential (primary) hypertension: Secondary | ICD-10-CM

## 2021-07-07 ENCOUNTER — Ambulatory Visit (INDEPENDENT_AMBULATORY_CARE_PROVIDER_SITE_OTHER): Payer: Medicare Other | Admitting: Family Medicine

## 2021-07-07 ENCOUNTER — Encounter: Payer: Self-pay | Admitting: Family Medicine

## 2021-07-07 VITALS — BP 146/82 | HR 77 | Temp 97.6°F | Resp 16 | Wt 139.4 lb

## 2021-07-07 DIAGNOSIS — C269 Malignant neoplasm of ill-defined sites within the digestive system: Secondary | ICD-10-CM

## 2021-07-07 DIAGNOSIS — Z0000 Encounter for general adult medical examination without abnormal findings: Secondary | ICD-10-CM

## 2021-07-07 DIAGNOSIS — I10 Essential (primary) hypertension: Secondary | ICD-10-CM

## 2021-07-07 MED ORDER — AMLODIPINE BESY-BENAZEPRIL HCL 5-20 MG PO CAPS: ORAL_CAPSULE | ORAL | 3 refills | Status: DC

## 2021-07-07 MED ORDER — METOPROLOL SUCCINATE ER 50 MG PO TB24: ORAL_TABLET | ORAL | 3 refills | Status: DC

## 2021-07-07 NOTE — Progress Notes (Signed)
? ? ? ?Annual Wellness Visit ? ? ?I,[name],acting as a scribe for [name], MD.,have documented all relevant documentation on the behalf of [name], MD,as directed by  [name], MD while in the presence of [name], MD.   ? ?Patient: [name], Male    DOB: Jul 23, 1940, 81 y.o.   MRN: [id number] ?Visit Date: 07/07/2021 ? ?Today's Provider: [name], MD  ? ?Chief Complaint  ?Patient presents with  ? Medicare Wellness  ? ?Subjective  ?  ?[name] is a 81 y.o. male who presents today for his Annual Wellness Visit. Annual Physical. ?He reports consuming a general diet. He generally feels well. He reports sleeping well. Patient walks 6 days per week for 3 miles. He does have additional problems to discuss today.  ?He And his wife have recently downsized.  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  Kept his weight down for the last 20+ years as he had a partial gastrectomy and esophagectomy at [facility] for mass that was curative for any cancer. ? ?HPI ? ? ? ?Medications: ?Outpatient Medications Prior to Visit  ?Medication Sig  ? Multiple Vitamins tablet Take by mouth.  ? [DISCONTINUED] amLODipine-benazepril (LOTREL) 5-20 MG capsule TAKE 1 CAPSULE BY MOUTH DAILY  ? [DISCONTINUED] metoprolol succinate (TOPROL-XL) 50 MG 24 hr tablet TAKE 1 TABLET EVERY DAY, TAKE WITH OR IMMEDIATELY FOLLOWING A MEAL (NEED MD APPOINTMENT)  ? benzonatate (TESSALON) 100 MG capsule Take 1 capsule (100 mg total) by mouth every 8 (eight) hours.  ? fluticasone (FLONASE) 50 MCG/ACT nasal spray Place 2 sprays into both nostrils daily.  ? ?No facility-administered medications prior to visit.  ?  ?Allergies  ?Allergen Reactions  ? Codeine Phosphate [Codeine] Other (See Comments) and Hives  ?  Dizziness  ? ? ?Patient Care Team: ?Jerrol Banana., MD as PCP - General (Family Medicine) ? ?Review of Systems  ?HENT:  Positive for hearing loss.   ?Musculoskeletal:  Positive for myalgias.   ?Allergic/Immunologic: Positive for food allergies.  ?All other systems reviewed and are negative. ? ?Last thyroid functions ?Lab Results  ?Component Value Date  ? TSH 3.450 07/08/2021  ? ?  ?  ? Objective  ?  ?Vitals: BP (!) 146/82 (BP Location: Right Arm, Patient Position: Sitting, Cuff Size: Normal)   Pulse 77   Temp 97.6 ?F (36.4 ?C) (Oral)   Resp 16   Wt 139 lb 6.4 oz (63.2 kg)   SpO2 100%   BMI 20.59 kg/m?  ?BP Readings from Last 3 Encounters:  ?07/07/21 (!) 146/82  ?03/18/21 137/78  ?04/23/20 130/75  ? ?Wt Readings from Last 3 Encounters:  ?07/07/21 139 lb 6.4 oz (63.2 kg)  ?06/15/19 146 lb (66.2 kg)  ?12/26/17 143 lb (64.9 kg)  ? ?  ? ? ?Physical Exam ?Vitals reviewed.  ?Constitutional:   ?   Appearance: Normal appearance. He is well-developed.  ?HENT:  ?   Head: Normocephalic and atraumatic.  ?   Right Ear: Tympanic membrane and external ear normal.  ?   Left Ear: Tympanic membrane and external ear normal.  ?   Mouth/Throat:  ?   Mouth: Mucous membranes are moist.  ?Eyes:  ?   General: No scleral icterus. ?   Conjunctiva/sclera: Conjunctivae normal.  ?   Pupils: Pupils are equal, round, and reactive to light.  ?Cardiovascular:  ?   Rate and Rhythm: Normal rate and regular rhythm.  ?   Heart sounds:  Normal heart sounds.  ?Pulmonary:  ?   Effort: Pulmonary effort is normal.  ?   Breath sounds: Normal breath sounds.  ?Abdominal:  ?   General: Bowel sounds are normal.  ?   Palpations: Abdomen is soft.  ?Genitourinary: ?   Penis: Normal.   ?   Testes: Normal.  ?Lymphadenopathy:  ?   Cervical: No cervical adenopathy.  ?Skin: ?   General: Skin is warm and dry.  ?Neurological:  ?   General: No focal deficit present.  ?   Mental Status: He is alert and oriented to person, place, and time.  ?Psychiatric:     ?   Mood and Affect: Mood normal.     ?   Behavior: Behavior normal.     ?   Thought Content: Thought content normal.     ?   Judgment: Judgment normal.  ? ? ? ?Most recent functional status  assessment: ? ?  07/07/2021  ?  3:48 PM  ?In your present state of health, do you have any difficulty performing the following activities:  ?Hearing? 1  ?Vision? 0  ?Difficulty concentrating or making decisions? 0  ?Walking or climbing stairs? 0  ?Dressing or bathing? 0  ?Doing errands, shopping? 0  ? ?Most recent fall risk assessment: ? ?  07/07/2021  ?  3:47 PM  ?Fall Risk   ?Falls in the past year? 0  ?Number falls in past yr: 0  ?Injury with Fall? 0  ?Follow up Falls evaluation completed  ? ? Most recent depression screenings: ? ?  07/07/2021  ?  3:48 PM 06/15/2019  ?  3:20 PM  ?PHQ 2/9 Scores  ?PHQ - 2 Score 0 0  ?PHQ- 9 Score 1   ? ?Most recent cognitive screening: ?   ? View : No data to display.  ?  ?  ?  ? ?Most recent Audit-C alcohol use screening ? ?  07/07/2021  ?  3:48 PM  ?Alcohol Use Disorder Test (AUDIT)  ?1. How often do you have a drink containing alcohol? 0  ?2. How many drinks containing alcohol do you have on a typical day when you are drinking? 0  ?3. How often do you have six or more drinks on one occasion? 0  ?AUDIT-C Score 0  ? ?A score of 3 or more in women, and 4 or more in men indicates increased risk for alcohol abuse, EXCEPT if all of the points are from question 1  ? ?No results found for any visits on 07/07/21. ? Assessment & Plan  ?  ? ?Annual wellness visit done today including the all of the following: ?Reviewed patient's Family Medical History ?Reviewed and updated list of patient's medical providers ?Assessment of cognitive impairment was done ?Assessed patient's functional ability ?Established a written schedule for health screening services ?Health Risk Assessent Completed and Reviewed ? ?Exercise Activities and Dietary recommendations ? Goals   ?None ?  ? ? ?Immunization History  ?Administered Date(s) Administered  ? Influenza, High Dose Seasonal PF 12/26/2017  ? Influenza-Unspecified 12/29/2015, 01/20/2017  ? Moderna Sars-Covid-2 Vaccination 03/29/2019, 04/27/2019  ? Pneumococcal  Conjugate-13 06/25/2013  ? Pneumococcal Polysaccharide-23 12/29/2015  ? ? ?Health Maintenance  ?Topic Date Due  ? Hepatitis C Screening  Never done  ? TETANUS/TDAP  Never done  ? Zoster Vaccines- Shingrix (1 of 2) Never done  ? COVID-19 Vaccine (3 - Moderna risk series) 05/25/2019  ? INFLUENZA VACCINE  10/27/2021  ? COLONOSCOPY (Pts 45-62yr Insurance coverage will  need to be confirmed)  09/23/2022  ? Pneumonia Vaccine 44+ Years old  Completed  ? HPV VACCINES  Aged Out  ? ? ? ?Discussed health benefits of physical activity, and encouraged him to engage in regular exercise appropriate for his age and condition.  ? ?1. Encounter for Medicare annual wellness exam ? ?- Lipid panel ?- TSH ?- CBC w/Diff/Platelet ?- Comprehensive Metabolic Panel (CMET) ? ?2. Annual physical exam ?Overall health and quality of life very good. ? ?3. Essential hypertension ?Blood pressure reading was 125/77 ?- Lipid panel ?- TSH ?- CBC w/Diff/Platelet ?- Comprehensive Metabolic Panel (CMET) ?- metoprolol succinate (TOPROL-XL) 50 MG 24 hr tablet; TAKE 1 TABLET EVERY DAY, TAKE WITH OR IMMEDIATELY FOLLOWING A MEAL  Dispense: 90 tablet; Refill: 3 ?- amLODipine-benazepril (LOTREL) 5-20 MG capsule; TAKE 1 CAPSULE BY MOUTH DAILY  Dispense: 90 capsule; Refill: 3 ? ?4. Encounter for annual wellness visit (AWV) in Medicare patient ? ? ?5. 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 ?Patient weight stays down because of gastrectomy and he has lactose intolerance ? ?6. Primary hypertension ? ? ? ? ?Return in about 6 months (around 01/06/2022).  ?  ? ?I, [name], MD, have reviewed all documentation for this visit. The documentation on 07/11/21 for the exam, diagnosis, procedures, and orders are all accurate and complete. ? ? ? ?Deatra Mcmahen Cranford Mon, MD  ?[facility] ?[phone] (phone) ?[phone] (fax) ? ?[facility]  ?

## 2021-07-09 LAB — LIPID PANEL
Chol/HDL Ratio: 2.3 ratio (ref 0.0–5.0)
Cholesterol, Total: 118 mg/dL (ref 100–199)
HDL: 52 mg/dL (ref >39)
LDL Chol Calc (NIH): 53 mg/dL (ref 0–99)
Triglycerides: 57 mg/dL (ref 0–149)
VLDL Cholesterol Cal: 13 mg/dL (ref 5–40)

## 2021-07-09 LAB — CBC WITH DIFFERENTIAL/PLATELET
Basophils Absolute: 0.1 10*3/uL (ref 0.0–0.2)
Basos: 1 %
EOS (ABSOLUTE): 0.2 10*3/uL (ref 0.0–0.4)
Eos: 4 %
Hematocrit: 46.7 % (ref 37.5–51.0)
Hemoglobin: 16.2 g/dL (ref 13.0–17.7)
Immature Grans (Abs): 0 10*3/uL (ref 0.0–0.1)
Immature Granulocytes: 0 %
Lymphocytes Absolute: 1.5 10*3/uL (ref 0.7–3.1)
Lymphs: 27 %
MCH: 32.6 pg (ref 26.6–33.0)
MCHC: 34.7 g/dL (ref 31.5–35.7)
MCV: 94 fL (ref 79–97)
Monocytes Absolute: 0.5 10*3/uL (ref 0.1–0.9)
Monocytes: 9 %
Neutrophils Absolute: 3.3 10*3/uL (ref 1.4–7.0)
Neutrophils: 59 %
Platelets: 172 10*3/uL (ref 150–450)
RBC: 4.97 x10E6/uL (ref 4.14–5.80)
RDW: 13 % (ref 11.6–15.4)
WBC: 5.7 10*3/uL (ref 3.4–10.8)

## 2021-07-09 LAB — COMPREHENSIVE METABOLIC PANEL
ALT: 22 IU/L (ref 0–44)
AST: 21 IU/L (ref 0–40)
Albumin/Globulin Ratio: 2.2 (ref 1.2–2.2)
Albumin: 4.2 g/dL (ref 3.7–4.7)
Alkaline Phosphatase: 113 IU/L (ref 44–121)
BUN/Creatinine Ratio: 11 (ref 10–24)
BUN: 11 mg/dL (ref 8–27)
Bilirubin Total: 2.4 mg/dL — ABNORMAL HIGH (ref 0.0–1.2)
CO2: 25 mmol/L (ref 20–29)
Calcium: 8.8 mg/dL (ref 8.6–10.2)
Chloride: 106 mmol/L (ref 96–106)
Creatinine, Ser: 0.97 mg/dL (ref 0.76–1.27)
Globulin, Total: 1.9 g/dL (ref 1.5–4.5)
Glucose: 106 mg/dL — ABNORMAL HIGH (ref 70–99)
Potassium: 3.7 mmol/L (ref 3.5–5.2)
Sodium: 143 mmol/L (ref 134–144)
Total Protein: 6.1 g/dL (ref 6.0–8.5)
eGFR: 79 mL/min/{1.73_m2} (ref >59)

## 2021-07-09 LAB — TSH: TSH: 3.45 u[IU]/mL (ref 0.450–4.500)

## 2022-01-01 ENCOUNTER — Ambulatory Visit
Admission: EM | Admit: 2022-01-01 | Discharge: 2022-01-01 | Disposition: A | Discharge status: Home / Self Care | Payer: Medicare Other

## 2022-01-01 DIAGNOSIS — R197 Diarrhea, unspecified: Secondary | ICD-10-CM

## 2022-01-01 DIAGNOSIS — R1012 Left upper quadrant pain: Secondary | ICD-10-CM

## 2022-01-01 NOTE — ED Triage Notes (Addendum)
Patient presents to UC for LLQ since Tuesday. Developed fever (103.0) and diarrhea. Taking tylenol. States continued fever and diarrhea. He thought symptoms may be related to food poisoning.

## 2022-01-01 NOTE — Discharge Instructions (Addendum)
   Keep yourself hydrated with clear liquids, such as water and Gatorade.  Follow the diarrhea diet.    Go to the emergency department if you have persistent or worsening symptoms.    Follow up with your primary care provider.

## 2022-01-01 NOTE — ED Provider Notes (Signed)
[name]    CSN: [id number] Arrival date & time: 01/01/22  1155      History   Chief Complaint Chief Complaint  Patient presents with   Abdominal Pain   Fever    HPI [name] is a 81 y.o. male.  Patient presents with diarrhea, fever, and abdominal cramping x3 days.  His symptoms started after he ate an undercooked hamburger.  The abdominal cramping has improved; it was across his entire upper abdomen but now is less intense and only in his left upper quadrant.  T-max 103 on the first day of his symptoms.  No fever-reducing medications taken today.  He took Pepto-Bismol last night.  One episode of diarrhea today.  No nausea, vomiting, chest pain, shortness of breath, or other symptoms.  No recent travel or antibiotic use.  His medical history includes transhiatal esophagogastrectomy in 2003, history of type B viral hepatitis, elevated bilirubin, kidney stones, hypertension.    The history is provided by the patient and medical records.    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   Hypertension 08/03/2007    Past Surgical History:  Procedure Laterality Date   APPENDECTOMY     During college   CHOLECYSTECTOMY  1970   COLONOSCOPY WITH PROPOFOL N/A 09/22/2017   Procedure: COLONOSCOPY WITH PROPOFOL;  Surgeon: Lucilla Lame, MD;  Location: [place];  Service: Endoscopy;  Laterality: N/A;   ESOPHAGOGASTRODUODENOSCOPY (EGD) WITH PROPOFOL N/A  09/22/2017   Procedure: ESOPHAGOGASTRODUODENOSCOPY (EGD) WITH PROPOFOL;  Surgeon: Lucilla Lame, MD;  Location: [place];  Service: Endoscopy;  Laterality: N/A;   HERNIA REPAIR Left 1970   POLYPECTOMY  09/22/2017   Procedure: POLYPECTOMY;  Surgeon: Lucilla Lame, MD;  Location: [place];  Service: Endoscopy;;   Resection of esophageal cancer  2003   TONSILLECTOMY AND ADENOIDECTOMY     as a child   VASECTOMY         Home Medications    Prior to Admission medications   Medication Sig Start Date End Date Taking? Authorizing Provider  amLODipine-benazepril (LOTREL) 5-20 MG capsule TAKE 1 CAPSULE BY MOUTH DAILY 07/07/21   Jerrol Banana., MD  benzonatate (TESSALON) 100 MG capsule Take 1 capsule (100 mg total) by mouth every 8 (eight) hours. 03/18/21   Hughie Closs, PA-C  fluticasone (FLONASE) 50 MCG/ACT nasal spray Place 2 sprays into both nostrils daily. 03/18/21   Hughie Closs, PA-C  metoprolol succinate (TOPROL-XL) 50 MG 24 hr tablet TAKE 1 TABLET EVERY DAY, TAKE WITH OR IMMEDIATELY FOLLOWING A MEAL 07/07/21   Jerrol Banana., MD  Multiple Vitamins tablet Take by mouth.    [provider]    Family History Family History  Problem Relation Age of Onset   Cancer Sister        Breast cancer  Social History Social History   Tobacco Use   Smoking status: Former    Packs/day: 2.00    Years: 10.00    Total pack years: 20.00    Types: Cigarettes    Quit date: 03/28/1972    Years since quitting: 49.7   Smokeless tobacco: Never  Substance Use Topics   Alcohol use: No   Drug use: No     Allergies   Codeine phosphate [codeine]   Review of Systems Review of Systems  Constitutional:  Positive for fever. Negative for chills.  Respiratory:  Negative for cough and shortness of breath.   Cardiovascular:  Negative for chest pain and palpitations.  Gastrointestinal:  Positive for abdominal pain and diarrhea. Negative for blood  in stool, nausea and vomiting.  Genitourinary:  Negative for dysuria and hematuria.  Skin:  Negative for rash.  All other systems reviewed and are negative.    Physical Exam Triage Vital Signs ED Triage Vitals  Enc Vitals Group     BP 01/01/22 1210 (!) 156/89     Pulse Rate 01/01/22 1210 86     Resp 01/01/22 1210 18     Temp 01/01/22 1210 97.8 F (36.6 C)     Temp src --      SpO2 01/01/22 1210 97 %     Weight --      Height --      Head Circumference --      Peak Flow --      Pain Score 01/01/22 1215 3     Pain Loc --      Pain Edu? --      Excl. in [place]? --    No data found.  Updated Vital Signs BP (!) 156/89   Pulse 86   Temp 97.8 F (36.6 C)   Resp 18   SpO2 97%   Visual Acuity Right Eye Distance:   Left Eye Distance:   Bilateral Distance:    Right Eye Near:   Left Eye Near:    Bilateral Near:     Physical Exam Vitals and nursing note reviewed.  Constitutional:      General: He is not in acute distress.    Appearance: He is well-developed. He is not ill-appearing.  HENT:     Mouth/Throat:     Mouth: Mucous membranes are moist.  Cardiovascular:     Rate and Rhythm: Normal rate and regular rhythm.     Heart sounds: Normal heart sounds.  Pulmonary:     Effort: Pulmonary effort is normal. No respiratory distress.     Breath sounds: Normal breath sounds.  Abdominal:     Palpations: Abdomen is soft.     Tenderness: There is abdominal tenderness in the left upper quadrant. There is no right CVA tenderness, left CVA tenderness, guarding or rebound.     Comments: LUQ mildly tender to palpation. No rebound or guarding. No CVAT.   Musculoskeletal:     Cervical back: Neck supple.  Skin:    General: Skin is warm and dry.  Neurological:     Mental Status: He is alert.  Psychiatric:        Mood and Affect: Mood normal.        Behavior: Behavior normal.      UC Treatments / Results  Labs (all labs ordered are listed, but only abnormal results are  displayed) Labs Reviewed - No data to display  EKG   Radiology No results found.  Procedures Procedures (including critical  care time)  Medications Ordered in UC Medications - No data to display  Initial Impression / Assessment and Plan / UC Course  I have reviewed the triage vital signs and the nursing notes.  Pertinent labs & imaging results that were available during my care of the patient were reviewed by me and considered in my medical decision making (see chart for details).   Diarrhea, LUQ abdominal pain.  Afebrile and vital signs are stable.  Discussed limitations of evaluation in the urgent care setting.  Patient declines transfer to the ED.  He declines blood work today.  Symptoms are improving and he has only had 1 episode of diarrhea today.  Instructed patient to keep himself hydrated with clear liquids and to follow the diarrhea diet.  ED precautions discussed.  Education provided on diarrhea and abdominal pain.  Instructed patient to follow-up with his PCP.  He agrees to plan of care.   Final Clinical Impressions(s) / UC Diagnoses   Final diagnoses:  Diarrhea, unspecified type  Left upper quadrant abdominal pain     Discharge Instructions        Keep yourself hydrated with clear liquids, such as water and Gatorade.  Follow the diarrhea diet.    Go to the emergency department if you have persistent or worsening symptoms.    Follow up with your primary care provider.          ED Prescriptions   None    PDMP not reviewed this encounter.   Sharion Balloon, NP 01/01/22 1311

## 2022-01-12 ENCOUNTER — Ambulatory Visit: Payer: Medicare Other | Admitting: Family Medicine

## 2022-01-12 ENCOUNTER — Encounter: Payer: Self-pay | Admitting: Ophthalmology

## 2022-01-18 NOTE — Discharge Instructions (Signed)

## 2022-01-19 ENCOUNTER — Ambulatory Visit
Admission: RE | Admit: 2022-01-19 | Discharge: 2022-01-19 | Disposition: A | Discharge status: Home / Self Care | Payer: Medicare Other | Attending: Ophthalmology | Admitting: Ophthalmology

## 2022-01-19 ENCOUNTER — Other Ambulatory Visit: Payer: Self-pay

## 2022-01-19 ENCOUNTER — Ambulatory Visit: Payer: Medicare Other | Admitting: Anesthesiology

## 2022-01-19 ENCOUNTER — Encounter: Payer: Self-pay | Admitting: Ophthalmology

## 2022-01-19 ENCOUNTER — Encounter
Admission: RE | Disposition: A | Discharge status: Home / Self Care | Payer: Self-pay | Source: Home / Self Care | Attending: Ophthalmology

## 2022-01-19 DIAGNOSIS — Z87891 Personal history of nicotine dependence: Secondary | ICD-10-CM | POA: Diagnosis not present

## 2022-01-19 DIAGNOSIS — H2511 Age-related nuclear cataract, right eye: Secondary | ICD-10-CM | POA: Insufficient documentation

## 2022-01-19 DIAGNOSIS — I10 Essential (primary) hypertension: Secondary | ICD-10-CM | POA: Diagnosis not present

## 2022-01-19 HISTORY — PX: CATARACT EXTRACTION W/PHACO: SHX586

## 2022-01-19 HISTORY — DX: Presence of external hearing-aid: Z97.4

## 2022-01-19 SURGERY — PHACOEMULSIFICATION, CATARACT, WITH IOL INSERTION
Anesthesia: Monitor Anesthesia Care | Site: Eye | Laterality: Right

## 2022-01-19 MED ORDER — ARMC OPHTHALMIC DILATING DROPS
1.0000 | OPHTHALMIC | Status: DC | PRN
  Administered 2022-01-19 (×3): 1 via OPHTHALMIC

## 2022-01-19 MED ORDER — SIGHTPATH DOSE#1 NA CHONDROIT SULF-NA HYALURON 40-17 MG/ML IO SOLN
INTRAOCULAR | Status: DC | PRN
  Administered 2022-01-19: 1 mL via INTRAOCULAR

## 2022-01-19 MED ORDER — MOXIFLOXACIN HCL 0.5 % OP SOLN
OPHTHALMIC | Status: DC | PRN
  Administered 2022-01-19: 0.2 mL via OPHTHALMIC

## 2022-01-19 MED ORDER — BRIMONIDINE TARTRATE-TIMOLOL 0.2-0.5 % OP SOLN
OPHTHALMIC | Status: DC | PRN
  Administered 2022-01-19: 1 [drp] via OPHTHALMIC

## 2022-01-19 MED ORDER — TETRACAINE HCL 0.5 % OP SOLN
1.0000 [drp] | OPHTHALMIC | Status: DC | PRN
  Administered 2022-01-19 (×3): 1 [drp] via OPHTHALMIC

## 2022-01-19 MED ORDER — SIGHTPATH DOSE#1 BSS IO SOLN
INTRAOCULAR | Status: DC | PRN
  Administered 2022-01-19: 72 mL via OPHTHALMIC

## 2022-01-19 MED ORDER — FENTANYL CITRATE (PF) 100 MCG/2ML IJ SOLN
INTRAMUSCULAR | Status: DC | PRN
  Administered 2022-01-19: 50 ug via INTRAVENOUS

## 2022-01-19 MED ORDER — SIGHTPATH DOSE#1 BSS IO SOLN
INTRAOCULAR | Status: DC | PRN
  Administered 2022-01-19: 15 mL

## 2022-01-19 MED ORDER — MIDAZOLAM HCL 2 MG/2ML IJ SOLN
INTRAMUSCULAR | Status: DC | PRN
  Administered 2022-01-19: 1 mg via INTRAVENOUS

## 2022-01-19 MED ORDER — SIGHTPATH DOSE#1 BSS IO SOLN
INTRAOCULAR | Status: DC | PRN
  Administered 2022-01-19: 1 mL via INTRAMUSCULAR

## 2022-01-19 SURGICAL SUPPLY — 9 items
CATARACT SUITE SIGHTPATH (MISCELLANEOUS) ×1 IMPLANT
FEE CATARACT SUITE SIGHTPATH (MISCELLANEOUS) ×1 IMPLANT
GLOVE SURG ENC TEXT LTX SZ8 (GLOVE) ×1 IMPLANT
GLOVE SURG TRIUMPH 8.0 PF LTX (GLOVE) ×1 IMPLANT
LENS IOL TECNIS EYHANCE 20.0 (Intraocular Lens) IMPLANT
NDL FILTER BLUNT 18X1 1/2 (NEEDLE) ×1 IMPLANT
NEEDLE FILTER BLUNT 18X1 1/2 (NEEDLE) ×1 IMPLANT
SYR 3ML LL SCALE MARK (SYRINGE) ×1 IMPLANT
WATER STERILE IRR 250ML POUR (IV SOLUTION) ×1 IMPLANT

## 2022-01-19 NOTE — Anesthesia Postprocedure Evaluation (Signed)
Anesthesia Post Note  Patient: [name] [id number]  Procedure(s) Performed: CATARACT EXTRACTION PHACO AND INTRAOCULAR LENS PLACEMENT (IOC) RIGHT  14.57 01:17.8 (Right: Eye)  Patient location during evaluation: PACU Anesthesia Type: MAC Level of consciousness: awake and alert Pain management: pain level controlled Vital Signs Assessment: post-procedure vital signs reviewed and stable Respiratory status: spontaneous breathing, nonlabored ventilation, respiratory function stable and patient connected to nasal cannula oxygen Cardiovascular status: blood pressure returned to baseline and stable Postop Assessment: no apparent nausea or vomiting Anesthetic complications: no   No notable events documented.   Last Vitals:  Vitals:   01/19/22 0753 01/19/22 0800  BP: (!) 151/87 130/81  Pulse: 63 (!) 56  Resp: 18 18  Temp: (!) 36.2 C (!) 36.2 C  SpO2: 97% 96%    Last Pain:  Vitals:   01/19/22 0800  TempSrc:   PainSc: 0-No pain                 Dimas Millin

## 2022-01-19 NOTE — H&P (Signed)
[facility]   Primary Care Physician:  Jerrol Banana., MD Ophthalmologist: Dr.Doyle Kunath  Pre-Procedure History & Physical: HPI:  AYAZ SONDGEROTH is a 81 y.o. male here for cataract surgery.   Past Medical History:  Diagnosis Date   Arthritis    wrist and ankle   Cancer ([place])    H/O type B viral hepatitis 1960   History of esophageal cancer    History of kidney stones    Hypertension    TMJ (temporomandibular joint syndrome)    has had a issure with this lately   Wears hearing aid in both ears    Has, does not wear often    Past Surgical History:  Procedure Laterality Date   APPENDECTOMY     During college   CHOLECYSTECTOMY  1970   COLONOSCOPY WITH PROPOFOL N/A 09/22/2017   Procedure: COLONOSCOPY WITH PROPOFOL;  Surgeon: Lucilla Lame, MD;  Location: [place];  Service: Endoscopy;  Laterality: N/A;   ESOPHAGOGASTRODUODENOSCOPY (EGD) WITH PROPOFOL N/A 09/22/2017   Procedure: ESOPHAGOGASTRODUODENOSCOPY (EGD) WITH PROPOFOL;  Surgeon: Lucilla Lame, MD;  Location: [place];  Service: Endoscopy;  Laterality: N/A;   HERNIA REPAIR Left 1970   POLYPECTOMY  09/22/2017   Procedure: POLYPECTOMY;  Surgeon: Lucilla Lame, MD;  Location: [place];  Service: Endoscopy;;   Resection of esophageal cancer  2003   TONSILLECTOMY AND ADENOIDECTOMY     as a child   VASECTOMY      Prior to Admission medications   Medication Sig Start Date End Date Taking? Authorizing Provider  amLODipine-benazepril (LOTREL) 5-20 MG capsule TAKE 1 CAPSULE BY MOUTH DAILY 07/07/21  Yes Jerrol Banana., MD  Cyanocobalamin (B-12 SL) Place under the tongue daily.   Yes [provider]  metoprolol succinate (TOPROL-XL) 50 MG 24 hr tablet TAKE 1 TABLET EVERY DAY, TAKE WITH OR IMMEDIATELY FOLLOWING A MEAL 07/07/21  Yes Jerrol Banana., MD  OVER THE COUNTER MEDICATION daily. Morning kick (powder) by [place]   Yes [provider]  Vitamin  D-Vitamin K (K2 PLUS D3 PO) Take by mouth daily.   Yes [provider]    Allergies as of 11/27/2021 - Review Complete 07/07/2021  Allergen Reaction Noted   Codeine phosphate [codeine] Other (See Comments) and Hives 05/01/2015    Family History  Problem Relation Age of Onset   Cancer Sister        Breast cancer    Social History   Socioeconomic History   Marital status: Married    Spouse name: Not on file   Number of children: Not on file   Years of education: Not on file   Highest education level: Not on file  Occupational History   Not on file  Tobacco Use   Smoking status: Former    Packs/day: 2.00    Years: 10.00    Total pack years: 20.00    Types: Cigarettes    Quit date: 03/28/1972    Years since quitting: 49.8   Smokeless tobacco: Never  Vaping Use   Vaping Use: Never used  Substance and Sexual Activity   Alcohol use: No   Drug use: No   Sexual activity: Not on file  Other Topics Concern   Not on file  Social History Narrative   Not on file   Social Determinants of Health   Financial Resource Strain: Not on file  Food Insecurity: Not on file  Transportation Needs: Not on file  Physical Activity:  Not on file  Stress: Not on file  Social Connections: Not on file  Intimate Partner Violence: Not on file    Review of Systems: See HPI, otherwise negative ROS  Physical Exam: BP (!) 175/97   Pulse 73   Temp 98.8 F (37.1 C) (Temporal)   Resp 18   Ht '5\' 9"'$  (1.753 m)   Wt 60.8 kg   SpO2 99%   BMI 19.79 kg/m  General:   Alert, cooperative in NAD Head:  Normocephalic and atraumatic. Respiratory:  Normal work of breathing. Cardiovascular:  RRR  Impression/Plan: JOSEPHUS HARRIGER is here for cataract surgery.  Risks, benefits, limitations, and alternatives regarding cataract surgery have been reviewed with the patient.  Questions have been answered.  All parties agreeable.   Birder Robson, MD  01/19/2022, 7:13 AM

## 2022-01-19 NOTE — Op Note (Signed)
PREOPERATIVE DIAGNOSIS:  Nuclear sclerotic cataract of the right eye.   POSTOPERATIVE DIAGNOSIS:  H25.11 Cataract   OPERATIVE PROCEDURE:ORPROCALL@   SURGEON:  Birder Robson, MD.   ANESTHESIA:  Anesthesiologist: Dimas Millin, MD CRNA: Jacqualin Combes, CRNA  1.      Managed anesthesia care. 2.      0.51m of Shugarcaine was instilled in the eye following the paracentesis.   COMPLICATIONS:  None.   TECHNIQUE:   Stop and chop   DESCRIPTION OF PROCEDURE:  The patient was examined and consented in the preoperative holding area where the aforementioned topical anesthesia was applied to the right eye and then brought back to the Operating Room where the right eye was prepped and draped in the usual sterile ophthalmic fashion and a lid speculum was placed. A paracentesis was created with the side port blade and the anterior chamber was filled with viscoelastic. A near clear corneal incision was performed with the steel keratome. A continuous curvilinear capsulorrhexis was performed with a cystotome followed by the capsulorrhexis forceps. Hydrodissection and hydrodelineation were carried out with BSS on a blunt cannula. The lens was removed in a stop and chop  technique and the remaining cortical material was removed with the irrigation-aspiration handpiece. The capsular bag was inflated with viscoelastic and the Technis [id number]  lens was placed in the capsular bag without complication. The remaining viscoelastic was removed from the eye with the irrigation-aspiration handpiece. The wounds were hydrated. The anterior chamber was flushed with BSS and the eye was inflated to physiologic pressure. 0.187mof Vigamox was placed in the anterior chamber. The wounds were found to be water tight. The eye was dressed with Combigan. The patient was given protective glasses to wear throughout the day and a shield with which to sleep tonight. The patient was also given drops with which to begin a drop regimen  today and will follow-up with me in one day. Implant Name Type Inv. Item Serial No. Manufacturer Lot No. LRB No. Used Action  LENS IOL TECNIS EYHANCE 20.0 - [id number] Lens LENS IOL TECNIS EYHANCE 20.0 [id number]  Right 1 Implanted   Procedure(s): CATARACT EXTRACTION PHACO AND INTRAOCULAR LENS PLACEMENT (IOC) RIGHT  14.57 01:17.8 (Right)  Electronically signed: WiBirder Robson0/24/2023 7:52 AM

## 2022-01-19 NOTE — Transfer of Care (Signed)
Immediate Anesthesia Transfer of Care Note  Patient: [name] [id number]  Procedure(s) Performed: CATARACT EXTRACTION PHACO AND INTRAOCULAR LENS PLACEMENT (IOC) RIGHT  14.57 01:17.8 (Right: Eye)  Patient Location: PACU  Anesthesia Type: MAC  Level of Consciousness: awake, alert  and patient cooperative  Airway and Oxygen Therapy: Patient Spontanous Breathing and Patient connected to supplemental oxygen  Post-op Assessment: Post-op Vital signs reviewed, Patient's Cardiovascular Status Stable, Respiratory Function Stable, Patent Airway and No signs of Nausea or vomiting  Post-op Vital Signs: Reviewed and stable  Complications: No notable events documented.

## 2022-01-19 NOTE — Anesthesia Preprocedure Evaluation (Signed)
Anesthesia Evaluation  Patient identified by MRN, date of birth, ID band Patient awake    Reviewed: Allergy & Precautions, H&P , NPO status , Patient's Chart, lab work & pertinent test results  Airway Mallampati: II  TM Distance: >3 FB Neck ROM: full    Dental no notable dental hx. (+) Chipped, Teeth Intact   Pulmonary neg pulmonary ROS, former smoker,    Pulmonary exam normal        Cardiovascular hypertension, On Medications negative cardio ROS Normal cardiovascular exam     Neuro/Psych negative neurological ROS  negative psych ROS   GI/Hepatic negative GI ROS, Neg liver ROS,   Endo/Other  negative endocrine ROS  Renal/GU negative Renal ROS     Musculoskeletal   Abdominal   Peds  Hematology negative hematology ROS (+)   Anesthesia Other Findings Past Medical History: No date: Arthritis     Comment:  wrist and ankle No date: Cancer ([place]) 1960: H/O type B viral hepatitis No date: History of esophageal cancer No date: History of kidney stones No date: Hypertension No date: TMJ (temporomandibular joint syndrome)     Comment:  has had a issure with this lately No date: Wears hearing aid in both ears     Comment:  Has, does not wear often  Past Surgical History: No date: APPENDECTOMY     Comment:  During college 1970: CHOLECYSTECTOMY 09/22/2017: COLONOSCOPY WITH PROPOFOL; N/A     Comment:  Procedure: COLONOSCOPY WITH PROPOFOL;  Surgeon: Lucilla Lame, MD;  Location: [place];  Service:               Endoscopy;  Laterality: N/A; 09/22/2017: ESOPHAGOGASTRODUODENOSCOPY (EGD) WITH PROPOFOL; N/A     Comment:  Procedure: ESOPHAGOGASTRODUODENOSCOPY (EGD) WITH               PROPOFOL;  Surgeon: Lucilla Lame, MD;  Location: [place];  Service: Endoscopy;  Laterality: N/A; 1970: HERNIA REPAIR; Left 09/22/2017: POLYPECTOMY     Comment:  Procedure: POLYPECTOMY;  Surgeon:  Lucilla Lame, MD;                Location: [place];  Service: Endoscopy;; 2003: Resection of esophageal cancer No date: TONSILLECTOMY AND ADENOIDECTOMY     Comment:  as a child No date: VASECTOMY  BMI    Body Mass Index: 19.79 kg/m      Reproductive/Obstetrics negative OB ROS                             Anesthesia Physical  Anesthesia Plan  ASA: II  Anesthesia Plan: MAC   Post-op Pain Management:    Induction: Intravenous  PONV Risk Score and Plan: 1  Airway Management Planned: Natural Airway and Nasal Cannula  Additional Equipment:   Intra-op Plan:   Post-operative Plan:   Informed Consent: I have reviewed the patients History and Physical, chart, labs and discussed the procedure including the risks, benefits and alternatives for the proposed anesthesia with the patient or authorized representative who has indicated his/her understanding and acceptance.     Dental Advisory Given  Plan Discussed with: Anesthesiologist, CRNA and Surgeon  Anesthesia Plan Comments: (Patient consented for risks of anesthesia including but not limited to:  - adverse reactions to medications - damage to eyes,  teeth, lips or other oral mucosa - nerve damage due to positioning  - sore throat or hoarseness - Damage to heart, brain, nerves, lungs, other parts of body or loss of life  Patient voiced understanding.)        Anesthesia Quick Evaluation

## 2022-01-20 ENCOUNTER — Encounter: Payer: Self-pay | Admitting: Ophthalmology

## 2022-01-28 NOTE — Discharge Instructions (Signed)

## 2022-02-02 ENCOUNTER — Ambulatory Visit
Admission: RE | Admit: 2022-02-02 | Discharge: 2022-02-02 | Disposition: A | Discharge status: Home / Self Care | Payer: Medicare Other | Attending: Ophthalmology | Admitting: Ophthalmology

## 2022-02-02 ENCOUNTER — Other Ambulatory Visit: Payer: Self-pay

## 2022-02-02 ENCOUNTER — Ambulatory Visit: Payer: Medicare Other | Admitting: Anesthesiology

## 2022-02-02 ENCOUNTER — Encounter: Payer: Self-pay | Admitting: Ophthalmology

## 2022-02-02 ENCOUNTER — Encounter
Admission: RE | Disposition: A | Discharge status: Home / Self Care | Payer: Self-pay | Source: Home / Self Care | Attending: Ophthalmology

## 2022-02-02 DIAGNOSIS — Z79899 Other long term (current) drug therapy: Secondary | ICD-10-CM | POA: Insufficient documentation

## 2022-02-02 DIAGNOSIS — I10 Essential (primary) hypertension: Secondary | ICD-10-CM | POA: Diagnosis not present

## 2022-02-02 DIAGNOSIS — Z87891 Personal history of nicotine dependence: Secondary | ICD-10-CM | POA: Diagnosis not present

## 2022-02-02 DIAGNOSIS — H2512 Age-related nuclear cataract, left eye: Secondary | ICD-10-CM | POA: Insufficient documentation

## 2022-02-02 HISTORY — PX: CATARACT EXTRACTION W/PHACO: SHX586

## 2022-02-02 SURGERY — PHACOEMULSIFICATION, CATARACT, WITH IOL INSERTION
Anesthesia: Monitor Anesthesia Care | Site: Eye | Laterality: Left

## 2022-02-02 MED ORDER — BRIMONIDINE TARTRATE-TIMOLOL 0.2-0.5 % OP SOLN
OPHTHALMIC | Status: DC | PRN
  Administered 2022-02-02: 1 [drp] via OPHTHALMIC

## 2022-02-02 MED ORDER — TETRACAINE HCL 0.5 % OP SOLN
1.0000 [drp] | OPHTHALMIC | Status: DC | PRN
  Administered 2022-02-02 (×3): 1 [drp] via OPHTHALMIC

## 2022-02-02 MED ORDER — SIGHTPATH DOSE#1 BSS IO SOLN
INTRAOCULAR | Status: DC | PRN
  Administered 2022-02-02: 15 mL via INTRAOCULAR

## 2022-02-02 MED ORDER — FENTANYL CITRATE (PF) 100 MCG/2ML IJ SOLN
INTRAMUSCULAR | Status: DC | PRN
  Administered 2022-02-02: 100 ug via INTRAVENOUS

## 2022-02-02 MED ORDER — MIDAZOLAM HCL 2 MG/2ML IJ SOLN
INTRAMUSCULAR | Status: DC | PRN
  Administered 2022-02-02: 2 mg via INTRAVENOUS

## 2022-02-02 MED ORDER — ARMC OPHTHALMIC DILATING DROPS
1.0000 | OPHTHALMIC | Status: DC | PRN
  Administered 2022-02-02 (×3): 1 via OPHTHALMIC

## 2022-02-02 MED ORDER — SIGHTPATH DOSE#1 NA CHONDROIT SULF-NA HYALURON 40-17 MG/ML IO SOLN
INTRAOCULAR | Status: DC | PRN
  Administered 2022-02-02: 1 mL via INTRAOCULAR

## 2022-02-02 MED ORDER — SIGHTPATH DOSE#1 BSS IO SOLN
INTRAOCULAR | Status: DC | PRN
  Administered 2022-02-02: 2 mL

## 2022-02-02 MED ORDER — SIGHTPATH DOSE#1 BSS IO SOLN
INTRAOCULAR | Status: DC | PRN
  Administered 2022-02-02: 72 mL via OPHTHALMIC

## 2022-02-02 MED ORDER — MOXIFLOXACIN HCL 0.5 % OP SOLN
OPHTHALMIC | Status: DC | PRN
  Administered 2022-02-02: 0.2 mL via OPHTHALMIC

## 2022-02-02 SURGICAL SUPPLY — 11 items
CANNULA ANT/CHMB 27G (MISCELLANEOUS) IMPLANT
CANNULA ANT/CHMB 27GA (MISCELLANEOUS) IMPLANT
CATARACT SUITE SIGHTPATH (MISCELLANEOUS) ×1 IMPLANT
FEE CATARACT SUITE SIGHTPATH (MISCELLANEOUS) ×1 IMPLANT
GLOVE SURG ENC TEXT LTX SZ8 (GLOVE) ×1 IMPLANT
GLOVE SURG TRIUMPH 8.0 PF LTX (GLOVE) ×1 IMPLANT
LENS IOL TECNIS EYHANCE 19.0 (Intraocular Lens) IMPLANT
NDL FILTER BLUNT 18X1 1/2 (NEEDLE) ×1 IMPLANT
NEEDLE FILTER BLUNT 18X1 1/2 (NEEDLE) ×1 IMPLANT
SYR 3ML LL SCALE MARK (SYRINGE) ×1 IMPLANT
WATER STERILE IRR 250ML POUR (IV SOLUTION) ×1 IMPLANT

## 2022-02-02 NOTE — Anesthesia Preprocedure Evaluation (Addendum)
Anesthesia Evaluation  Patient identified by MRN, date of birth, ID band Patient awake    Reviewed: Allergy & Precautions, H&P , NPO status , Patient's Chart, lab work & pertinent test results  Airway Mallampati: II  TM Distance: >3 FB Neck ROM: full    Dental no notable dental hx. (+) Teeth Intact   Pulmonary former smoker   Pulmonary exam normal        Cardiovascular hypertension, On Medications Normal cardiovascular exam     Neuro/Psych negative neurological ROS  negative psych ROS   GI/Hepatic negative GI ROS, Neg liver ROS,,,  Endo/Other  negative endocrine ROS    Renal/GU negative Renal ROS     Musculoskeletal   Abdominal   Peds  Hematology negative hematology ROS (+)   Anesthesia Other Findings Past Medical History: No date: Arthritis     Comment:  wrist and ankle No date: Cancer ([place]) 1960: H/O type B viral hepatitis No date: History of esophageal cancer No date: History of kidney stones No date: Hypertension No date: TMJ (temporomandibular joint syndrome)     Comment:  has had a issure with this lately No date: Wears hearing aid in both ears     Comment:  Has, does not wear often  Past Surgical History: No date: APPENDECTOMY     Comment:  During college 1970: CHOLECYSTECTOMY 09/22/2017: COLONOSCOPY WITH PROPOFOL; N/A     Comment:  Procedure: COLONOSCOPY WITH PROPOFOL;  Surgeon: Lucilla Lame, MD;  Location: [place];  Service:               Endoscopy;  Laterality: N/A; 09/22/2017: ESOPHAGOGASTRODUODENOSCOPY (EGD) WITH PROPOFOL; N/A     Comment:  Procedure: ESOPHAGOGASTRODUODENOSCOPY (EGD) WITH               PROPOFOL;  Surgeon: Lucilla Lame, MD;  Location: [place];  Service: Endoscopy;  Laterality: N/A; 1970: HERNIA REPAIR; Left 09/22/2017: POLYPECTOMY     Comment:  Procedure: POLYPECTOMY;  Surgeon: Lucilla Lame, MD;                Location:  [place];  Service: Endoscopy;; 2003: Resection of esophageal cancer No date: TONSILLECTOMY AND ADENOIDECTOMY     Comment:  as a child No date: VASECTOMY  BMI    Body Mass Index: 19.79 kg/m      Reproductive/Obstetrics negative OB ROS                             Anesthesia Physical Anesthesia Plan  ASA: II  Anesthesia Plan: MAC   Post-op Pain Management:    Induction: Intravenous  PONV Risk Score and Plan: 1  Airway Management Planned: Natural Airway and Nasal Cannula  Additional Equipment:   Intra-op Plan:   Post-operative Plan:   Informed Consent: I have reviewed the patients History and Physical, chart, labs and discussed the procedure including the risks, benefits and alternatives for the proposed anesthesia with the patient or authorized representative who has indicated his/her understanding and acceptance.     Dental Advisory Given  Plan Discussed with: Anesthesiologist, CRNA and Surgeon  Anesthesia Plan Comments:         Anesthesia Quick Evaluation

## 2022-02-02 NOTE — H&P (Signed)
[name]   Primary Care Physician:  Jerrol Banana., MD Ophthalmologist: Dr. George Ina  Pre-Procedure History & Physical: HPI:  [name] is a 81 y.o. male here for cataract surgery.   Past Medical History:  Diagnosis Date   Arthritis    wrist and ankle   Cancer ([place])    H/O type B viral hepatitis 1960   History of esophageal cancer    History of kidney stones    Hypertension    TMJ (temporomandibular joint syndrome)    has had a issure with this lately   Wears hearing aid in both ears    Has, does not wear often    Past Surgical History:  Procedure Laterality Date   APPENDECTOMY     During college   CATARACT EXTRACTION W/PHACO Right 01/19/2022   Procedure: CATARACT EXTRACTION PHACO AND INTRAOCULAR LENS PLACEMENT (IOC) RIGHT  14.57 01:17.8;  Surgeon: Birder Robson, MD;  Location: [facility];  Service: Ophthalmology;  Laterality: Right;   CHOLECYSTECTOMY  1970   COLONOSCOPY WITH PROPOFOL N/A 09/22/2017   Procedure: COLONOSCOPY WITH PROPOFOL;  Surgeon: Lucilla Lame, MD;  Location: [facility];  Service: Endoscopy;  Laterality: N/A;   ESOPHAGOGASTRODUODENOSCOPY (EGD) WITH PROPOFOL N/A 09/22/2017   Procedure: ESOPHAGOGASTRODUODENOSCOPY (EGD) WITH PROPOFOL;  Surgeon: Lucilla Lame, MD;  Location: [facility];  Service: Endoscopy;  Laterality: N/A;   HERNIA REPAIR Left 1970   POLYPECTOMY  09/22/2017   Procedure: POLYPECTOMY;  Surgeon: Lucilla Lame, MD;  Location: [facility];  Service: Endoscopy;;   Resection of esophageal cancer  2003   TONSILLECTOMY AND ADENOIDECTOMY     as a child   VASECTOMY      Prior to Admission medications   Medication Sig Start Date End Date Taking? Authorizing Provider  amLODipine-benazepril (LOTREL) 5-20 MG capsule TAKE 1 CAPSULE BY MOUTH DAILY 07/07/21  Yes Jerrol Banana., MD  metoprolol succinate (TOPROL-XL) 50 MG 24 hr tablet TAKE 1 TABLET EVERY DAY, TAKE WITH OR IMMEDIATELY FOLLOWING  A MEAL 07/07/21  Yes Jerrol Banana., MD  OVER THE COUNTER MEDICATION daily. Morning kick (powder) by [facility]   Yes [provider]  Vitamin D-Vitamin K (K2 PLUS D3 PO) Take by mouth daily.   Yes [provider]  Cyanocobalamin (B-12 SL) Place under the tongue daily.    [provider]    Allergies as of 11/27/2021 - Review Complete 07/07/2021  Allergen Reaction Noted   Codeine phosphate [codeine] Other (See Comments) and Hives 05/01/2015    Family History  Problem Relation Age of Onset   Cancer Sister        Breast cancer    Social History   Socioeconomic History   Marital status: Married    Spouse name: Not on file   Number of children: Not on file   Years of education: Not on file   Highest education level: Not on file  Occupational History   Not on file  Tobacco Use   Smoking status: Former    Packs/day: 2.00    Years: 10.00    Total pack years: 20.00    Types: Cigarettes    Quit date: 03/28/1972    Years since quitting: 49.8   Smokeless tobacco: Never  Vaping Use   Vaping Use: Never used  Substance and Sexual Activity   Alcohol use: No   Drug use: No   Sexual activity: Not on file  Other Topics Concern   Not on  file  Social History Narrative   Not on file   Social Determinants of Health   Financial Resource Strain: Not on file  Food Insecurity: Not on file  Transportation Needs: Not on file  Physical Activity: Not on file  Stress: Not on file  Social Connections: Not on file  Intimate Partner Violence: Not on file    Review of Systems: See HPI, otherwise negative ROS  Physical Exam: BP (!) 177/97   Pulse 74   Temp 98.4 F (36.9 C) (Temporal)   Resp 15   Ht '5\' 9"'$  (1.753 m)   Wt 61.5 kg   SpO2 98%   BMI 20.01 kg/m  General:   Alert, cooperative in NAD Head:  Normocephalic and atraumatic. Respiratory:  Normal work of breathing. Cardiovascular:  RRR  Impression/Plan: [name] is here for cataract  surgery.  Risks, benefits, limitations, and alternatives regarding cataract surgery have been reviewed with the patient.  Questions have been answered.  All parties agreeable.   Birder Robson, MD  02/02/2022, 11:16 AM

## 2022-02-02 NOTE — Transfer of Care (Signed)
Immediate Anesthesia Transfer of Care Note  Patient: [name] [id number]  Procedure(s) Performed: CATARACT EXTRACTION PHACO AND INTRAOCULAR LENS PLACEMENT (IOC) LEFT DIABETIC 9.24 01:07.7 (Left: Eye)  Patient Location: PACU  Anesthesia Type: MAC  Level of Consciousness: awake, alert  and patient cooperative  Airway and Oxygen Therapy: Patient Spontanous Breathing and Patient connected to supplemental oxygen  Post-op Assessment: Post-op Vital signs reviewed, Patient's Cardiovascular Status Stable, Respiratory Function Stable, Patent Airway and No signs of Nausea or vomiting  Post-op Vital Signs: Reviewed and stable  Complications: No notable events documented.

## 2022-02-02 NOTE — Op Note (Signed)
PREOPERATIVE DIAGNOSIS:  Nuclear sclerotic cataract of the left eye.   POSTOPERATIVE DIAGNOSIS:  Nuclear sclerotic cataract of the left eye.   OPERATIVE PROCEDURE:ORPROCALL@   SURGEON:  Birder Robson, MD.   ANESTHESIA:  Anesthesiologist: Iran Ouch, MD CRNA: Tobie Poet, CRNA  1.      Managed anesthesia care. 2.     0.47m of Shugarcaine was instilled following the paracentesis   COMPLICATIONS:  None.   TECHNIQUE:   Stop and chop   DESCRIPTION OF PROCEDURE:  The patient was examined and consented in the preoperative holding area where the aforementioned topical anesthesia was applied to the left eye and then brought back to the Operating Room where the left eye was prepped and draped in the usual sterile ophthalmic fashion and a lid speculum was placed. A paracentesis was created with the side port blade and the anterior chamber was filled with viscoelastic. A near clear corneal incision was performed with the steel keratome. A continuous curvilinear capsulorrhexis was performed with a cystotome followed by the capsulorrhexis forceps. Hydrodissection and hydrodelineation were carried out with BSS on a blunt cannula. The lens was removed in a stop and chop  technique and the remaining cortical material was removed with the irrigation-aspiration handpiece. The capsular bag was inflated with viscoelastic and the Technis [id number] lens was placed in the capsular bag without complication. The remaining viscoelastic was removed from the eye with the irrigation-aspiration handpiece. The wounds were hydrated. The anterior chamber was flushed with BSS and the eye was inflated to physiologic pressure. 0.160mVigamox was placed in the anterior chamber. The wounds were found to be water tight. The eye was dressed with Combigan. The patient was given protective glasses to wear throughout the day and a shield with which to sleep tonight. The patient was also given drops with which to begin a  drop regimen today and will follow-up with me in one day. Implant Name Type Inv. Item Serial No. Manufacturer Lot No. LRB No. Used Action  LENS IOL TECNIS EYHANCE 19.0 - [id number] Lens LENS IOL TECNIS EYHANCE 19.0 [id number]  Left 1 Implanted    Procedure(s): CATARACT EXTRACTION PHACO AND INTRAOCULAR LENS PLACEMENT (IOC) LEFT DIABETIC 9.24 01:07.7 (Left)  Electronically signed: WiBirder Robson1/09/2021 11:43 AM

## 2022-02-02 NOTE — Anesthesia Postprocedure Evaluation (Signed)
Anesthesia Post Note  Patient: [name]  Procedure(s) Performed: CATARACT EXTRACTION PHACO AND INTRAOCULAR LENS PLACEMENT (IOC) LEFT DIABETIC 9.24 01:07.7 (Left: Eye)  Patient location during evaluation: PACU Anesthesia Type: MAC Level of consciousness: awake and alert Pain management: pain level controlled Vital Signs Assessment: post-procedure vital signs reviewed and stable Respiratory status: spontaneous breathing, nonlabored ventilation and respiratory function stable Cardiovascular status: blood pressure returned to baseline and stable Postop Assessment: no apparent nausea or vomiting Anesthetic complications: no   No notable events documented.   Last Vitals:  Vitals:   02/02/22 1144 02/02/22 1149  BP: 135/76 136/75  Pulse: 62 (!) 58  Resp: 18 19  Temp: 36.7 C (!) 36.4 C  SpO2: 97% 97%    Last Pain:  Vitals:   02/02/22 1149  TempSrc:   PainSc: 0-No pain                 Iran Ouch

## 2022-02-26 ENCOUNTER — Ambulatory Visit
Admission: EM | Admit: 2022-02-26 | Discharge: 2022-02-26 | Disposition: A | Discharge status: Home / Self Care | Payer: Medicare Other | Attending: Emergency Medicine | Admitting: Emergency Medicine

## 2022-02-26 DIAGNOSIS — R059 Cough, unspecified: Secondary | ICD-10-CM | POA: Diagnosis present

## 2022-02-26 DIAGNOSIS — B349 Viral infection, unspecified: Secondary | ICD-10-CM | POA: Diagnosis not present

## 2022-02-26 DIAGNOSIS — Z1152 Encounter for screening for COVID-19: Secondary | ICD-10-CM | POA: Diagnosis not present

## 2022-02-26 DIAGNOSIS — I10 Essential (primary) hypertension: Secondary | ICD-10-CM | POA: Insufficient documentation

## 2022-02-26 LAB — RESP PANEL BY RT-PCR (FLU A&B, COVID) ARPGX2
Influenza A by PCR: POSITIVE — AB
Influenza B by PCR: NEGATIVE
SARS Coronavirus 2 by RT PCR: NEGATIVE

## 2022-02-26 NOTE — Discharge Instructions (Addendum)
Your COVID and Flu tests are pending.  Take Tylenol or ibuprofen as needed for fever or discomfort.  Rest and keep yourself hydrated.    Follow-up with your primary care provider if your symptoms are not improving.

## 2022-02-26 NOTE — ED Provider Notes (Signed)
[name]    CSN: [id number] Arrival date & time: 02/26/22  0272      History   Chief Complaint Chief Complaint  Patient presents with   Fever   Cough    HPI [name] is a 81 y.o. male.  Patient presents with 3 day history of fever, headache, cough.  Tmax 102.  Treatment at home with Advil; last dose at 0700 this morning.  Symptoms started after traveling to [place].  No rash, sore throat, shortness of breath, vomiting, diarrhea, or other symptoms.  Negative COVID tests at home at the onset of his symptoms.  His medical history includes hypertension, esophageal cancer, kidney stones.   The history is provided by the patient and medical records.    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   Hypertension 08/03/2007    Past Surgical History:  Procedure Laterality Date   APPENDECTOMY     During college   CATARACT EXTRACTION W/PHACO Right 01/19/2022   Procedure: CATARACT EXTRACTION PHACO AND INTRAOCULAR LENS PLACEMENT (IOC) RIGHT  14.57 01:17.8;  Surgeon: Birder Robson, MD;  Location: [facility];  Service: Ophthalmology;  Laterality: Right;   CATARACT EXTRACTION W/PHACO Left 02/02/2022   Procedure: CATARACT EXTRACTION PHACO AND INTRAOCULAR LENS PLACEMENT (IOC) LEFT DIABETIC 9.24 01:07.7;  Surgeon: Birder Robson, MD;  Location: [facility];  Service: Ophthalmology;   Laterality: Left;   CHOLECYSTECTOMY  1970   COLONOSCOPY WITH PROPOFOL N/A 09/22/2017   Procedure: COLONOSCOPY WITH PROPOFOL;  Surgeon: Lucilla Lame, MD;  Location: [facility];  Service: Endoscopy;  Laterality: N/A;   ESOPHAGOGASTRODUODENOSCOPY (EGD) WITH PROPOFOL N/A 09/22/2017   Procedure: ESOPHAGOGASTRODUODENOSCOPY (EGD) WITH PROPOFOL;  Surgeon: Lucilla Lame, MD;  Location: [facility];  Service: Endoscopy;  Laterality: N/A;   HERNIA REPAIR Left 1970   POLYPECTOMY  09/22/2017   Procedure: POLYPECTOMY;  Surgeon: Lucilla Lame, MD;  Location: [facility];  Service: Endoscopy;;   Resection of esophageal cancer  2003   TONSILLECTOMY AND ADENOIDECTOMY     as a child   VASECTOMY         Home Medications    Prior to Admission medications   Medication Sig Start Date End Date Taking? Authorizing Provider  amLODipine-benazepril (LOTREL) 5-20 MG capsule TAKE 1 CAPSULE BY MOUTH DAILY 07/07/21   Jerrol Banana., MD  Cyanocobalamin (B-12 SL) Place under the tongue daily.    [provider]  metoprolol succinate (TOPROL-XL) 50 MG 24 hr tablet TAKE 1 TABLET EVERY DAY, TAKE WITH OR IMMEDIATELY FOLLOWING A MEAL 07/07/21   Jerrol Banana., MD  OVER THE COUNTER MEDICATION daily. Morning kick (powder) by [facility]    [provider]  Vitamin D-Vitamin K (K2 PLUS D3 PO) Take by mouth  daily.    [provider]    Family History Family History  Problem Relation Age of Onset   Cancer Sister        Breast cancer    Social History Social History   Tobacco Use   Smoking status: Former    Packs/day: 2.00    Years: 10.00    Total pack years: 20.00    Types: Cigarettes    Quit date: 03/28/1972    Years since quitting: 49.9   Smokeless tobacco: Never  Vaping Use   Vaping Use: Never used  Substance Use Topics   Alcohol use: No   Drug use: No     Allergies   Codeine phosphate [codeine]   Review of Systems Review of Systems   Constitutional:  Positive for fever. Negative for chills.  HENT:  Negative for ear pain and sore throat.   Respiratory:  Positive for cough. Negative for shortness of breath.   Cardiovascular:  Negative for chest pain and palpitations.  Gastrointestinal:  Negative for diarrhea and vomiting.  Skin:  Negative for rash.  Neurological:  Positive for headaches.  All other systems reviewed and are negative.    Physical Exam Triage Vital Signs ED Triage Vitals  Enc Vitals Group     BP      Pulse      Resp      Temp      Temp src      SpO2      Weight      Height      Head Circumference      Peak Flow      Pain Score      Pain Loc      Pain Edu?      Excl. in [place]?    No data found.  Updated Vital Signs BP (!) 152/83   Pulse 82   Temp 99 F (37.2 C)   Resp 18   Ht '5\' 9"'$  (1.753 m)   Wt 140 lb (63.5 kg)   SpO2 96%   BMI 20.67 kg/m   Visual Acuity Right Eye Distance:   Left Eye Distance:   Bilateral Distance:    Right Eye Near:   Left Eye Near:    Bilateral Near:     Physical Exam Vitals and nursing note reviewed.  Constitutional:      General: He is not in acute distress.    Appearance: Normal appearance. He is well-developed. He is not ill-appearing.  HENT:     Right Ear: Tympanic membrane normal.     Left Ear: Tympanic membrane normal.     Nose: Nose normal.     Mouth/Throat:     Mouth: Mucous membranes are moist.     Pharynx: Oropharynx is clear.  Cardiovascular:     Rate and Rhythm: Normal rate and regular rhythm.     Heart sounds: Normal heart sounds.  Pulmonary:     Effort: Pulmonary effort is normal. No respiratory distress.     Breath sounds: Normal breath sounds.  Musculoskeletal:     Cervical back: Neck supple.  Skin:    General: Skin is warm and dry.  Neurological:     Mental Status: He is alert.  Psychiatric:        Mood and Affect: Mood normal.        Behavior: Behavior normal.      UC Treatments / Results  Labs (all labs  ordered are listed, but only abnormal results are  displayed) Labs Reviewed  RESP PANEL BY RT-PCR (FLU A&B, COVID) [id number]    EKG   Radiology No results found.  Procedures Procedures (including critical care time)  Medications Ordered in UC Medications - No data to display  Initial Impression / Assessment and Plan / UC Course  I have reviewed the triage vital signs and the nursing notes.  Pertinent labs & imaging results that were available during my care of the patient were reviewed by me and considered in my medical decision making (see chart for details).   Viral illness. COVID and Flu pending.  If flu positive, recommend treatment with Tamiflu.  If COVID positive, recommend treatment with Paxlovid which patient reports he took when he had COVID last year and had good result.  GFR 79 on 07/08/2021.  Discussed symptomatic treatment including Tylenol or ibuprofen, rest, hydration.  Instructed patient to follow up with PCP if symptoms are not improving.  He agrees to plan of care.    Final Clinical Impressions(s) / UC Diagnoses   Final diagnoses:  Viral illness     Discharge Instructions      Your COVID and Flu tests are pending.  Take Tylenol or ibuprofen as needed for fever or discomfort.  Rest and keep yourself hydrated.    Follow-up with your primary care provider if your symptoms are not improving.         ED Prescriptions   None    PDMP not reviewed this encounter.   Sharion Balloon, NP 02/26/22 [phone]

## 2022-02-26 NOTE — ED Triage Notes (Addendum)
Patient to Urgent Care with complaints of  headaches, fevers, and dry cough. Reports symptoms started Tuesday.   States he woke up this morning and his temp was 102. Has been taking advil.   Two negative covid tests at home.

## 2022-02-27 ENCOUNTER — Telehealth: Payer: Self-pay | Admitting: Emergency Medicine

## 2022-02-27 MED ORDER — OSELTAMIVIR PHOSPHATE 75 MG PO CAPS: 75.0000 mg | ORAL_CAPSULE | Freq: Two times a day (BID) | ORAL | 0 refills | Status: DC

## 2022-02-27 NOTE — Telephone Encounter (Signed)
Attempted to reach patient via telephone to discuss positive influenza test.  Left message to call back.  Tamiflu sent to his pharmacy.

## 2022-04-21 ENCOUNTER — Ambulatory Visit
Admission: EM | Admit: 2022-04-21 | Discharge: 2022-04-21 | Disposition: A | Discharge status: Home / Self Care | Payer: Medicare Other | Attending: Urgent Care | Admitting: Urgent Care

## 2022-04-21 DIAGNOSIS — Z1152 Encounter for screening for COVID-19: Secondary | ICD-10-CM | POA: Diagnosis not present

## 2022-04-21 DIAGNOSIS — R519 Headache, unspecified: Secondary | ICD-10-CM | POA: Diagnosis not present

## 2022-04-21 DIAGNOSIS — R6889 Other general symptoms and signs: Secondary | ICD-10-CM | POA: Diagnosis present

## 2022-04-21 DIAGNOSIS — B349 Viral infection, unspecified: Secondary | ICD-10-CM | POA: Diagnosis not present

## 2022-04-21 NOTE — ED Provider Notes (Signed)
[name]    CSN: [id number] Arrival date & time: 04/21/22  0813      History   Chief Complaint Chief Complaint  Patient presents with   Sore Throat   Generalized Body Aches   Nasal Congestion   Chills   Headache    HPI [name] is a 82 y.o. male.    Sore Throat Associated symptoms include headaches.  Headache   Patient presents to urgent care with complaint of sore throat, headache, low-grade fever, chills, body aches, nasal congestion x 3 days.  He says he was treated for influenza A about 2 months ago.  Also endorses having received all of his vaccinations.  Using dayquil/nyquil to treat symptoms which he says has been effective.  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;  Service: Ophthalmology;  Laterality: Right;   CATARACT EXTRACTION W/PHACO Left 02/02/2022   Procedure: CATARACT EXTRACTION PHACO AND INTRAOCULAR LENS PLACEMENT (IOC) LEFT DIABETIC 9.24 01:07.7;  Surgeon: Birder Robson, MD;  Location: [facility];  Service: Ophthalmology;   Laterality: Left;   CHOLECYSTECTOMY  1970   COLONOSCOPY WITH PROPOFOL N/A 09/22/2017   Procedure: COLONOSCOPY WITH PROPOFOL;  Surgeon: Lucilla Lame, MD;  Location: [facility];  Service: Endoscopy;  Laterality: N/A;   ESOPHAGOGASTRODUODENOSCOPY (EGD) WITH PROPOFOL N/A 09/22/2017   Procedure: ESOPHAGOGASTRODUODENOSCOPY (EGD) WITH PROPOFOL;  Surgeon: Lucilla Lame, MD;  Location: [facility];  Service: Endoscopy;  Laterality: N/A;   HERNIA REPAIR Left 1970   POLYPECTOMY  09/22/2017   Procedure: POLYPECTOMY;  Surgeon: Lucilla Lame, MD;  Location: [facility];  Service: Endoscopy;;   Resection of esophageal cancer  2003   TONSILLECTOMY AND ADENOIDECTOMY     as a child   VASECTOMY         Home Medications    Prior to Admission medications   Medication Sig Start Date End Date Taking? Authorizing Provider  amLODipine-benazepril (LOTREL) 5-20 MG capsule TAKE 1 CAPSULE BY MOUTH DAILY 07/07/21   Jerrol Banana., MD  Cyanocobalamin (B-12 SL) Place under the tongue daily.    [provider]  metoprolol succinate (TOPROL-XL) 50 MG 24 hr tablet TAKE 1 TABLET EVERY DAY, TAKE WITH OR IMMEDIATELY FOLLOWING A MEAL 07/07/21   Jerrol Banana., MD  oseltamivir (TAMIFLU) 75 MG capsule Take 1 capsule (75 mg total) by mouth every 12 (twelve) hours. 02/27/22   Sharion Balloon, NP  OVER THE COUNTER  MEDICATION daily. Morning kick (powder) by [facility]    [provider]  Vitamin D-Vitamin K (K2 PLUS D3 PO) Take by mouth daily.    [provider]    Family History Family History  Problem Relation Age of Onset   Cancer Sister        Breast cancer    Social History Social History   Tobacco Use   Smoking status: Former    Packs/day: 2.00    Years: 10.00    Total pack years: 20.00    Types: Cigarettes    Quit date: 03/28/1972    Years since quitting: 50.0   Smokeless tobacco: Never  Vaping Use   Vaping Use: Never used  Substance Use Topics    Alcohol use: No   Drug use: No     Allergies   Codeine phosphate [codeine]   Review of Systems Review of Systems  Neurological:  Positive for headaches.     Physical Exam Triage Vital Signs ED Triage Vitals [04/21/22 0832]  Enc Vitals Group     BP 133/79     Pulse Rate 65     Resp 16     Temp 99 F (37.2 C)     Temp src      SpO2 94 %     Weight      Height      Head Circumference      Peak Flow      Pain Score 0     Pain Loc      Pain Edu?      Excl. in [place]?    No data found.  Updated Vital Signs BP 133/79   Pulse 65   Temp 99 F (37.2 C)   Resp 16   SpO2 94%   Visual Acuity Right Eye Distance:   Left Eye Distance:   Bilateral Distance:    Right Eye Near:   Left Eye Near:    Bilateral Near:     Physical Exam Vitals reviewed.  Constitutional:      Appearance: He is well-developed. He is not ill-appearing.  HENT:     Mouth/Throat:     Pharynx: Posterior oropharyngeal erythema present. No oropharyngeal exudate.  Cardiovascular:     Rate and Rhythm: Normal rate and regular rhythm.     Heart sounds: Normal heart sounds.  Pulmonary:     Effort: Pulmonary effort is normal.     Breath sounds: Normal breath sounds.  Skin:    General: Skin is warm and dry.  Neurological:     General: No focal deficit present.     Mental Status: He is alert and oriented to person, place, and time.  Psychiatric:        Mood and Affect: Mood normal.        Behavior: Behavior normal.      UC Treatments / Results  Labs (all labs ordered are listed, but only abnormal results are displayed) Labs Reviewed - No data to display  EKG   Radiology No results found.  Procedures Procedures (including critical care time)  Medications Ordered in UC Medications - No data to display  Initial Impression / Assessment and Plan / UC Course  I have reviewed the triage vital signs and the nursing notes.  Pertinent labs & imaging results that were available during my  care of the patient were reviewed by me and considered in my medical decision making (see chart for details).   Patient is afebrile here without  recent antipyretics. Satting well on room air. Overall is well appearing, well hydrated, without respiratory distress. Pulmonary exam is unremarkable.  Lungs CTAB without wheezing, rhonchi, rales.  Mild pharyngeal erythema is present without peritonsillar exudates.  Symptoms are consistent with an acute viral process including influenza and COVID.  He is outside the window for influenza A (which is less likely since he already tested positive for this early December).  We will swab for COVID, though he states he had 2 negative tests at home.  Consider Paxlovid if positive.  Otherwise recommending continued use of OTC medication to treat his mild symptoms.  Final Clinical Impressions(s) / UC Diagnoses   Final diagnoses:  None   Discharge Instructions   None    ED Prescriptions   None    PDMP not reviewed this encounter.   Rose Phi, [place] 04/21/22 [phone]

## 2022-04-21 NOTE — ED Triage Notes (Signed)
Pt. Presents to UC w/ c/o a sore throat, headache, low-grade fever, chills, body aches and nasal congestion that started 3 days ago.

## 2022-04-21 NOTE — Discharge Instructions (Signed)
You have been diagnosed with a viral upper respiratory infection based on your symptoms and exam. Viral illnesses cannot be treated with antibiotics - they are self limiting - and you should find your symptoms resolving within a few days. Get plenty of rest and non-caffeinated fluids. Watch for signs of dehydration including reduced urine output and dark colored urine.  We have performed a respiratory swab testing for COVID.  Antiviral therapy will be available if you have a positive result.    We recommend you use over-the-counter medications for symptom control including acetaminophen (Tylenol), ibuprofen (Advil/Motrin) or naproxen (Aleve) for throat pain, fever, chills or body aches. You may combine use of acetaminophen and ibuprofen/naproxen if needed.  Some patients find an pain-relieving throat spray such as Chloraseptic to be effective.  Also recommend cold/cough medication containing a cough suppressant such as dextromethorphan, as needed. Please note that some cough medications are not recommended if you suffer from hypertension.    Saline mist spray is helpful for removing excess mucus from your nose.  Room humidifiers are helpful to ease breathing at night. I recommend guaifenesin (Mucinex) with plenty of water throughout the day to help thin and loosen mucus secretions in your respiratory passages.   If appropriate based upon your other medical problems, you might also find relief of nasal/sinus congestion symptoms by using a nasal decongestant such as fluticasone (Flonase ) or pseudoephedrine (Sudafed sinus).  You will need to obtain Sudafed from behind the pharmacist counter.  Speak to the pharmacist to verify that you are not duplicating medications with other over-the-counter formulations that you may be using.

## 2022-04-22 LAB — SARS CORONAVIRUS 2 (TAT 6-24 HRS): SARS Coronavirus 2: NEGATIVE

## 2022-06-28 ENCOUNTER — Other Ambulatory Visit: Payer: Self-pay | Admitting: Family Medicine

## 2022-06-28 DIAGNOSIS — I10 Essential (primary) hypertension: Secondary | ICD-10-CM

## 2022-06-28 MED ORDER — METOPROLOL SUCCINATE ER 50 MG PO TB24: ORAL_TABLET | ORAL | 0 refills | Status: DC

## 2022-07-15 ENCOUNTER — Telehealth: Payer: Self-pay | Admitting: Family Medicine

## 2022-07-15 DIAGNOSIS — I10 Essential (primary) hypertension: Secondary | ICD-10-CM

## 2022-07-15 MED ORDER — AMLODIPINE BESY-BENAZEPRIL HCL 5-20 MG PO CAPS: ORAL_CAPSULE | ORAL | 0 refills | Status: AC

## 2022-07-15 NOTE — Telephone Encounter (Signed)
Patient advised he is getting a courtesy refill. He reports he will establish with Dr. Sullivan Lone.

## 2022-07-15 NOTE — Telephone Encounter (Signed)
[facility] pharmacy faxed refill request for the following medications:   amLODipine-benazepril (LOTREL) 5-20 MG capsule     Please advise

## 2022-09-04 ENCOUNTER — Other Ambulatory Visit: Payer: Self-pay | Admitting: Family Medicine

## 2022-09-04 DIAGNOSIS — I10 Essential (primary) hypertension: Secondary | ICD-10-CM

## 2022-09-06 NOTE — Telephone Encounter (Signed)
Unable to refill per protocol, last refill by another provider, Dr. Sullivan Lone. Patient needs to F/U with Dr. Sullivan Lone.  Requested Prescriptions  Pending Prescriptions Disp Refills   metoprolol succinate (TOPROL-XL) 50 MG 24 hr tablet [Pharmacy Med Name: Metoprolol Succinate ER 50 MG Oral Tablet Extended Release 24 Hour] 90 tablet 3    Sig: TAKE 1 TABLET BY MOUTH DAILY  WITH OR IMMEDIATELY FOLLOWING A  MEAL. (NEED TO SCHEDULE FOLLOW  UP WITH DR. GILBERT)     Cardiovascular:  Beta Blockers Failed - 09/04/2022  4:55 AM      Failed - Valid encounter within last 6 months    Recent Outpatient Visits           1 year ago Encounter for Medicare annual wellness exam   [place] [facility] Bosie Clos, MD   2 years ago Benign neoplasm of ascending colon   [facility] Bosie Clos, MD   3 years ago Muscle spasm   [place] [facility] Chrismon, Jodell Cipro, [place]   4 years ago Essential hypertension   [place] [facility] Bosie Clos, MD   5 years ago Light headed   [facility] Chrismon, Jodell Cipro, PA-C              Passed - Last BP in normal range    BP Readings from Last 1 Encounters:  04/21/22 133/79         Passed - Last Heart Rate in normal range    Pulse Readings from Last 1 Encounters:  04/21/22 65

## 2023-02-10 ENCOUNTER — Encounter: Payer: Self-pay | Admitting: *Deleted

## 2023-02-10 ENCOUNTER — Ambulatory Visit
Admission: EM | Admit: 2023-02-10 | Discharge: 2023-02-10 | Disposition: A | Discharge status: Home / Self Care | Payer: Medicare Other | Attending: Emergency Medicine | Admitting: Emergency Medicine

## 2023-02-10 DIAGNOSIS — J069 Acute upper respiratory infection, unspecified: Secondary | ICD-10-CM | POA: Diagnosis not present

## 2023-02-10 MED ORDER — AZITHROMYCIN 250 MG PO TABS: 250.0000 mg | ORAL_TABLET | Freq: Every day | ORAL | 0 refills | Status: DC

## 2023-02-10 NOTE — ED Triage Notes (Signed)
Patient states cough and right ear ache that started Sat, earache has resolved still some sore throat and yellow/green nasal congestion, headache around the eyes.  2 negative covid tests at home

## 2023-02-10 NOTE — Discharge Instructions (Signed)
Your symptoms today are most likely being caused by a virus and should steadily improve in time it can take up to 7 to 10 days before you truly start to see a turnaround however things will get better, if no improvement seen by Tuesday, November 19 you may begin use of azithromycin from the pharmacy to cover for bacteria    You can take Tylenol and/or Ibuprofen as needed for fever reduction and pain relief.   For cough: honey 1/2 to 1 teaspoon (you can dilute the honey in water or another fluid).  You can also use guaifenesin and dextromethorphan for cough. You can use a humidifier for chest congestion and cough.  If you don't have a humidifier, you can sit in the bathroom with the hot shower running.      For sore throat: try warm salt water gargles, cepacol lozenges, throat spray, warm tea or water with lemon/honey, popsicles or ice, or OTC cold relief medicine for throat discomfort.   For congestion: take a daily anti-histamine like Zyrtec, Claritin, and a oral decongestant, such as pseudoephedrine.  You can also use Flonase 1-2 sprays in each nostril daily.   It is important to stay hydrated: drink plenty of fluids (water, gatorade/powerade/pedialyte, juices, or teas) to keep your throat moisturized and help further relieve irritation/discomfort.

## 2023-02-10 NOTE — ED Provider Notes (Signed)
[name]    CSN: [id number] Arrival date & time: 02/10/23  1605      History   Chief Complaint Chief Complaint  Patient presents with   Otalgia   Sore Throat    HPI [name] is a 82 y.o. male.   Patient presents for evaluation of nasal congestion producing green sputum, sinus pressure above the eyes, scratchy sore throat mild chest tightness and intermittent generalized headaches present for 5 days.  He was experiencing aching to the right ear and right sided gland swelling which has resolved.  Home COVID test negative twice.  Denies presence of fever, cough, shortness of breath wheezing or abdominal symptoms.  No known sick contacts prior has attempted use of Advil and Tylenol which have been minimally effective.  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   Hypertension 08/03/2007    Past Surgical History:  Procedure Laterality Date   APPENDECTOMY     During college   CATARACT EXTRACTION W/PHACO Right 01/19/2022   Procedure: CATARACT EXTRACTION PHACO AND INTRAOCULAR LENS PLACEMENT (IOC) RIGHT  14.57 01:17.8;  Surgeon: Galen Manila, MD;  Location: [facility];  Service: Ophthalmology;  Laterality: Right;   CATARACT EXTRACTION W/PHACO Left 02/02/2022   Procedure: CATARACT EXTRACTION PHACO AND INTRAOCULAR LENS PLACEMENT (IOC) LEFT DIABETIC 9.24 01:07.7;  Surgeon: Galen Manila, MD;  Location: [facility];  Service: Ophthalmology;  Laterality: Left;   CHOLECYSTECTOMY  1970   COLONOSCOPY WITH PROPOFOL N/A 09/22/2017   Procedure: COLONOSCOPY WITH PROPOFOL;  Surgeon: Midge Minium, MD;  Location: [facility];  Service: Endoscopy;  Laterality: N/A;   ESOPHAGOGASTRODUODENOSCOPY (EGD) WITH PROPOFOL N/A 09/22/2017   Procedure: ESOPHAGOGASTRODUODENOSCOPY (EGD) WITH PROPOFOL;  Surgeon: Midge Minium, MD;  Location: [facility];  Service: Endoscopy;  Laterality: N/A;   HERNIA REPAIR Left 1970   POLYPECTOMY  09/22/2017   Procedure: POLYPECTOMY;  Surgeon: Midge Minium, MD;  Location: [facility];  Service: Endoscopy;;   Resection of esophageal cancer  2003   TONSILLECTOMY AND ADENOIDECTOMY     as a child   VASECTOMY         Home Medications    Prior to Admission medications   Medication Sig Start Date End Date Taking? Authorizing Provider  azithromycin (ZITHROMAX) 250 MG tablet Take 1 tablet (250 mg total) by mouth daily. Take first 2 tablets together, then 1 every day until finished. 02/15/23  Yes Serinity Ware, Elita Boone, NP  amLODipine-benazepril (LOTREL) 5-20 MG capsule TAKE 1 CAPSULE BY MOUTH DAILY 07/15/22   Malva Limes, MD  Cyanocobalamin (B-12 SL) Place under the tongue daily.    [provider]  metoprolol succinate (TOPROL-XL) 50 MG 24 hr tablet TAKE 1 TABLET EVERY DAY, TAKE WITH OR IMMEDIATELY FOLLOWING A MEAL.  NEED TO SCHEDULE FOLLOW UP WITH DR Sullivan Lone 06/28/22   Malva Limes, MD  oseltamivir (TAMIFLU) 75 MG capsule Take 1 capsule (75 mg total) by mouth every 12 (twelve) hours. 02/27/22   Mickie Bail, NP  OVER THE COUNTER MEDICATION daily. Morning kick (powder) by [facility]    [provider]  Vitamin D-Vitamin K (K2 PLUS D3 PO) Take by mouth daily.    [provider]    Family History Family History  Problem Relation Age of Onset   Cancer Sister        Breast cancer    Social  History Social History   Tobacco Use   Smoking status: Former    Current packs/day: 0.00    Average packs/day: 2.0 packs/day for 10.0 years (20.0 ttl pk-yrs)    Types: Cigarettes    Start date: 03/28/1962    Quit date: 03/28/1972    Years since quitting: 50.9   Smokeless tobacco: Never  Vaping Use   Vaping status: Never Used  Substance Use Topics   Alcohol use: No   Drug use: No     Allergies   Codeine phosphate [codeine]   Review of Systems Review of Systems  HENT:  Positive for ear pain.      Physical Exam Triage Vital Signs ED Triage Vitals  Encounter Vitals Group     BP 02/10/23 1626 (!) 149/79     Systolic BP Percentile --      Diastolic BP Percentile --      Pulse Rate 02/10/23 1626 62     Resp 02/10/23 1626 16     Temp 02/10/23 1626 98.8 F (37.1 C)     Temp src --      SpO2 02/10/23 1626 95 %     Weight 02/10/23 1624 140 lb (63.5 kg)     Height 02/10/23 1624 5\' 9"  (1.753 m)     Head Circumference --      Peak Flow --      Pain Score 02/10/23 1624 2     Pain Loc --      Pain Education --      Exclude from Growth Chart --    No data found.  Updated Vital Signs BP (!) 149/79 (BP Location: Left Arm)   Pulse 62   Temp 98.8 F (37.1 C)   Resp 16   Ht 5\' 9"  (1.753 m)   Wt 140 lb (63.5 kg)   SpO2 95%   BMI 20.67 kg/m   Visual Acuity Right Eye Distance:   Left Eye Distance:   Bilateral Distance:    Right Eye Near:   Left Eye Near:    Bilateral Near:     Physical Exam Constitutional:      Appearance: Normal appearance. He is well-developed.  HENT:     Head: Normocephalic.     Right Ear: Tympanic membrane, ear canal and external ear normal.     Left Ear: Tympanic membrane, ear canal and external ear normal.     Nose: Congestion present. No rhinorrhea.     Mouth/Throat:     Pharynx: Posterior oropharyngeal erythema present. No oropharyngeal exudate.  Eyes:     Extraocular Movements: Extraocular movements intact.  Cardiovascular:      Rate and Rhythm: Normal rate and regular rhythm.     Pulses: Normal pulses.     Heart sounds: Normal heart sounds.  Pulmonary:     Effort: Pulmonary effort is normal.     Breath  sounds: Normal breath sounds.  Skin:    General: Skin is warm and dry.  Neurological:     Mental Status: He is alert and oriented to person, place, and time. Mental status is at baseline.      UC Treatments / Results  Labs (all labs ordered are listed, but only abnormal results are displayed) Labs Reviewed - No data to display  EKG   Radiology No results found.  Procedures Procedures (including critical care time)  Medications Ordered in UC Medications - No data to display  Initial Impression / Assessment and Plan / UC Course  I have reviewed the triage vital signs and the nursing notes.  Pertinent labs & imaging results that were available during my care of the patient were reviewed by me and considered in my medical decision making (see chart for details).  Viral URI with cough  Patient is in no signs of distress nor toxic appearing.  Vital signs are stable.  Low suspicion for pneumonia, pneumothorax or bronchitis and therefore will defer imaging.  Watchful wait antibiotic placed at pharmacy for day 10 of illness if no improvement seen, prescribed azithromycin.  Recommended continued use of supportive care.  Deferring strep testing as there is no exudate and patient has not experienced fever.  Due to lack of fever deferring flu testing also outside of 3-day window for Tamiflu.  May use additional over-the-counter medications as needed for supportive care.  May follow-up with urgent care as needed if symptoms persist or worsen.  Final Clinical Impressions(s) / UC Diagnoses   Final diagnoses:  Viral URI with cough     Discharge Instructions      Your symptoms today are most likely being caused by a virus and should steadily improve in time it can take up to 7 to 10 days before you truly start  to see a turnaround however things will get better, if no improvement seen by Tuesday, November 19 you may begin use of azithromycin from the pharmacy to cover for bacteria    You can take Tylenol and/or Ibuprofen as needed for fever reduction and pain relief.   For cough: honey 1/2 to 1 teaspoon (you can dilute the honey in water or another fluid).  You can also use guaifenesin and dextromethorphan for cough. You can use a humidifier for chest congestion and cough.  If you don't have a humidifier, you can sit in the bathroom with the hot shower running.      For sore throat: try warm salt water gargles, cepacol lozenges, throat spray, warm tea or water with lemon/honey, popsicles or ice, or OTC cold relief medicine for throat discomfort.   For congestion: take a daily anti-histamine like Zyrtec, Claritin, and a oral decongestant, such as pseudoephedrine.  You can also use Flonase 1-2 sprays in each nostril daily.   It is important to stay hydrated: drink plenty of fluids (water, gatorade/powerade/pedialyte, juices, or teas) to keep your throat moisturized and help further relieve irritation/discomfort.    ED Prescriptions     Medication Sig Dispense Auth. Provider   azithromycin (ZITHROMAX) 250 MG tablet Take 1 tablet (250 mg total) by mouth daily. Take first 2 tablets together, then 1 every day until finished. 6 tablet Valinda Hoar, NP      PDMP not reviewed this encounter.   Valinda Hoar, NP 02/10/23 [phone]

## 2023-12-31 ENCOUNTER — Other Ambulatory Visit: Payer: Self-pay

## 2023-12-31 ENCOUNTER — Emergency Department

## 2023-12-31 ENCOUNTER — Emergency Department
Admission: EM | Admit: 2023-12-31 | Discharge: 2023-12-31 | Disposition: A | Discharge status: Home / Self Care | Attending: Emergency Medicine | Admitting: Emergency Medicine

## 2023-12-31 DIAGNOSIS — R0981 Nasal congestion: Secondary | ICD-10-CM | POA: Diagnosis not present

## 2023-12-31 DIAGNOSIS — R059 Cough, unspecified: Secondary | ICD-10-CM | POA: Diagnosis not present

## 2023-12-31 DIAGNOSIS — I10 Essential (primary) hypertension: Secondary | ICD-10-CM | POA: Diagnosis not present

## 2023-12-31 DIAGNOSIS — Z8501 Personal history of malignant neoplasm of esophagus: Secondary | ICD-10-CM | POA: Insufficient documentation

## 2023-12-31 DIAGNOSIS — R002 Palpitations: Secondary | ICD-10-CM | POA: Diagnosis present

## 2023-12-31 DIAGNOSIS — R0602 Shortness of breath: Secondary | ICD-10-CM | POA: Insufficient documentation

## 2023-12-31 LAB — HEPATIC FUNCTION PANEL
ALT: 13 U/L (ref 0–44)
AST: 19 U/L (ref 15–41)
Albumin: 3.2 g/dL — ABNORMAL LOW (ref 3.5–5.0)
Alkaline Phosphatase: 77 U/L (ref 38–126)
Bilirubin, Direct: 0.3 mg/dL — ABNORMAL HIGH (ref 0.0–0.2)
Indirect Bilirubin: 2 mg/dL — ABNORMAL HIGH (ref 0.3–0.9)
Total Bilirubin: 2.3 mg/dL — ABNORMAL HIGH (ref 0.0–1.2)
Total Protein: 6 g/dL — ABNORMAL LOW (ref 6.5–8.1)

## 2023-12-31 LAB — D-DIMER, QUANTITATIVE: D-Dimer, Quant: 1.31 ug{FEU}/mL — ABNORMAL HIGH (ref 0.00–0.50)

## 2023-12-31 LAB — BASIC METABOLIC PANEL WITH GFR
Anion gap: 9 (ref 5–15)
BUN: 25 mg/dL — ABNORMAL HIGH (ref 8–23)
CO2: 26 mmol/L (ref 22–32)
Calcium: 8.6 mg/dL — ABNORMAL LOW (ref 8.9–10.3)
Chloride: 100 mmol/L (ref 98–111)
Creatinine, Ser: 0.93 mg/dL (ref 0.61–1.24)
GFR, Estimated: >60 mL/min (ref >60)
Glucose, Bld: 220 mg/dL — ABNORMAL HIGH (ref 70–99)
Potassium: 3.6 mmol/L (ref 3.5–5.1)
Sodium: 135 mmol/L (ref 135–145)

## 2023-12-31 LAB — CBC
HCT: 44.1 % (ref 39.0–52.0)
Hemoglobin: 15.4 g/dL (ref 13.0–17.0)
MCH: 32.4 pg (ref 26.0–34.0)
MCHC: 34.9 g/dL (ref 30.0–36.0)
MCV: 92.8 fL (ref 80.0–100.0)
Platelets: 205 K/uL (ref 150–400)
RBC: 4.75 MIL/uL (ref 4.22–5.81)
RDW: 12.3 % (ref 11.5–15.5)
WBC: 11.8 K/uL — ABNORMAL HIGH (ref 4.0–10.5)
nRBC: 0 % (ref 0.0–0.2)

## 2023-12-31 LAB — RESP PANEL BY RT-PCR (RSV, FLU A&B, COVID)  RVPGX2
Influenza A by PCR: NEGATIVE
Influenza B by PCR: NEGATIVE
Resp Syncytial Virus by PCR: NEGATIVE
SARS Coronavirus 2 by RT PCR: NEGATIVE

## 2023-12-31 LAB — LIPASE, BLOOD: Lipase: 27 U/L (ref 11–51)

## 2023-12-31 LAB — TROPONIN I (HIGH SENSITIVITY)
Troponin I (High Sensitivity): 7 ng/L (ref <18)
Troponin I (High Sensitivity): 7 ng/L (ref <18)

## 2023-12-31 LAB — BRAIN NATRIURETIC PEPTIDE: B Natriuretic Peptide: 49 pg/mL (ref 0.0–100.0)

## 2023-12-31 LAB — TSH: TSH: 3.033 u[IU]/mL (ref 0.350–4.500)

## 2023-12-31 MED ORDER — IOHEXOL 350 MG/ML SOLN
75.0000 mL | Freq: Once | INTRAVENOUS | Status: AC | PRN
  Administered 2023-12-31: 75 mL via INTRAVENOUS

## 2023-12-31 NOTE — ED Notes (Signed)
 Lab to add on BNP.

## 2023-12-31 NOTE — ED Notes (Signed)
 Lab advised d-dimer is 1.31 however unsure why it does not show resulted on our end

## 2023-12-31 NOTE — ED Notes (Signed)
 Pt denies any needs at this time. Delay explained to pt.

## 2023-12-31 NOTE — Discharge Instructions (Signed)
 Your CT scan of the chest and lab tests are all okay today. Please follow up with cardiology for further evaluation of your symptoms.

## 2023-12-31 NOTE — ED Triage Notes (Signed)
 Pt c/o increased, irregular HR x 2 days; states his HR is 80-88 and feels like it's skipping. States he has had some SOB with exertion and one episode of abd pain

## 2023-12-31 NOTE — ED Notes (Signed)
 Pt denies any needs at this time.

## 2023-12-31 NOTE — ED Provider Notes (Signed)
 [facility] Provider Note    Event Date/Time   First MD Initiated Contact with Patient 12/31/23 1250     (approximate)   History   Palpitations   HPI  [name] is a 83 y.o. male with hypertension who comes in with concerns for irregular heartbeats.  Reports feeling like his heart is skipping.  Patient reports having his COVID-vaccine and flu vaccine about 2 weeks ago.  He reports after that he had a little bit of symptoms for 2 or 3 days but then they went away.  He reported however about 4 to 5 days ago he started to have symptoms again of some palpitations feeling like his heart rates were higher than normal.  Does report a little bit of associated congestion, coughing.  Reports normal heart rates or any 50s but they have been in the 80s and he feels a skipped beat.  He reports a little bit of shortness of breath with exertion but denies any at rest denies any chest pain.  Did report 1 episode of epigastric abdominal pain 2 days ago but this is since resolved.  He has had a prior history of cancer with esophagectomy and he is already had his gallbladder removed.   Physical Exam   Triage Vital Signs: ED Triage Vitals  Encounter Vitals Group     BP 12/31/23 1212 (!) 156/95     Girls Systolic BP Percentile --      Girls Diastolic BP Percentile --      Boys Systolic BP Percentile --      Boys Diastolic BP Percentile --      Pulse Rate 12/31/23 1212 85     Resp 12/31/23 1212 20     Temp 12/31/23 1212 98 F (36.7 C)     Temp Source 12/31/23 1212 Oral     SpO2 12/31/23 1212 95 %     Weight 12/31/23 1211 140 lb (63.5 kg)     Height 12/31/23 1211 5' 9 (1.753 m)     Head Circumference --      Peak Flow --      Pain Score 12/31/23 1209 0     Pain Loc --      Pain Education --      Exclude from Growth Chart --     Most recent vital signs: Vitals:   12/31/23 1212  BP: (!) 156/95  Pulse: 85  Resp: 20  Temp: 98 F (36.7 C)  SpO2: 95%      General: Awake, no distress.  CV:  Good peripheral perfusion.  Resp:  Normal effort.  Clear lung Abd:  No distention.  Soft nontender Other:  No swelling in legs   ED Results / Procedures / Treatments   Labs (all labs ordered are listed, but only abnormal results are displayed) Labs Reviewed  BASIC METABOLIC PANEL WITH GFR - Abnormal; Notable for the following components:      Result Value   Glucose, Bld 220 (*)    BUN 25 (*)    Calcium 8.6 (*)    All other components within normal limits  CBC - Abnormal; Notable for the following components:   WBC 11.8 (*)    All other components within normal limits  HEPATIC FUNCTION PANEL - Abnormal; Notable for the following components:   Total Protein 6.0 (*)    Albumin 3.2 (*)    Total Bilirubin 2.3 (*)    Bilirubin, Direct 0.3 (*)    Indirect  Bilirubin 2.0 (*)    All other components within normal limits  D-DIMER, QUANTITATIVE - Abnormal; Notable for the following components:   D-Dimer, Quant 1.31 (*)    All other components within normal limits  RESP PANEL BY RT-PCR (RSV, FLU A&B, COVID)  RVPGX2  LIPASE, BLOOD  TSH  BRAIN NATRIURETIC PEPTIDE  TROPONIN I (HIGH SENSITIVITY)  TROPONIN I (HIGH SENSITIVITY)     EKG  My interpretation of EKG:  Sinus rhythm rate of 87 without any ST elevation, occasional PAC, incomplete right bundle branch block  RADIOLOGY I have reviewed the xray personally and interpreted trace edema noted   PROCEDURES:  Critical Care performed: No  .1-3 Lead EKG Interpretation  Performed by: Ernest Ronal BRAVO, MD Authorized by: Ernest Ronal BRAVO, MD     Interpretation: normal     ECG rate:  80   ECG rate assessment: normal     Rhythm: sinus rhythm     Ectopy: PAC and PVCs     Conduction: normal      MEDICATIONS ORDERED IN ED: Medications - No data to display   IMPRESSION / MDM / ASSESSMENT AND PLAN / ED COURSE  I reviewed the triage vital signs and the nursing notes.   Patient's  presentation is most consistent with acute presentation with potential threat to life or bodily function.   Patient comes in with some palpitations and elevated heart rates workup was done to evaluate for Electra abnormalities, AKI.  Did report some abdominal discomfort 2 days ago but this since resolved.  He has already had his gallbladder out.  Will check labs to make sure no pancreatitis, choledocholithiasis. Given the shortness of breath with exertion will get D-dimer, troponins to evaluate for ACS.  However the patient does report a mild cough and some congestion.  Reports taking a COVID test yesterday and so I suspect that this could be viral or infectious in nature.  BMP does show elevated glucose of 220.  His D-dimer is positive at 1.3.  White count is slightly elevated 11.8 troponin was negative.  Hepatic function does show elevated T. bili but similar to prior and he has had this previously with normal lipase and his repeat abdominal exam is soft and nontender.  We discussed elevated D-dimer we will proceed with CT PE.  Offered to scan patient's abdomen but he denies any pain here and declines needing to have a CT done of his abdomen.  Bnp normal although chest xray shows possible mild effusions  Patient we had opted on continuing pending repeat troponin, CT imaging if negative suspect patient can follow-up outpatient we discussed that we cannot predict future heart attacks if he does or develops any chest pain, shortness of breath at rest that he will need to be reevaluated.  Otherwise we will put a referral in for cardiology.   The patient is on the cardiac monitor to evaluate for evidence of arrhythmia and/or significant heart rate changes.      FINAL CLINICAL IMPRESSION(S) / ED DIAGNOSES   Final diagnoses:  Palpitations     Rx / DC Orders   ED Discharge Orders     None        Note:  This document was prepared using Dragon voice recognition software and may include  unintentional dictation errors.   Ernest Ronal BRAVO, MD 12/31/23 [phone]

## 2024-04-25 ENCOUNTER — Emergency Department

## 2024-04-25 ENCOUNTER — Other Ambulatory Visit: Payer: Self-pay

## 2024-04-25 ENCOUNTER — Inpatient Hospital Stay
Admission: EM | Admit: 2024-04-25 | Discharge: 2024-04-27 | DRG: 064 | Disposition: A | Discharge status: Home / Self Care | Attending: Student | Admitting: Student

## 2024-04-25 DIAGNOSIS — I441 Atrioventricular block, second degree: Secondary | ICD-10-CM | POA: Diagnosis present

## 2024-04-25 DIAGNOSIS — Z87898 Personal history of other specified conditions: Secondary | ICD-10-CM

## 2024-04-25 DIAGNOSIS — E785 Hyperlipidemia, unspecified: Secondary | ICD-10-CM | POA: Diagnosis present

## 2024-04-25 DIAGNOSIS — I6340 Cerebral infarction due to embolism of unspecified cerebral artery: Principal | ICD-10-CM | POA: Diagnosis present

## 2024-04-25 DIAGNOSIS — R471 Dysarthria and anarthria: Secondary | ICD-10-CM | POA: Diagnosis present

## 2024-04-25 DIAGNOSIS — Z8501 Personal history of malignant neoplasm of esophagus: Secondary | ICD-10-CM

## 2024-04-25 DIAGNOSIS — Z681 Body mass index (BMI) 19 or less, adult: Secondary | ICD-10-CM

## 2024-04-25 DIAGNOSIS — R4701 Aphasia: Secondary | ICD-10-CM | POA: Diagnosis present

## 2024-04-25 DIAGNOSIS — I10 Essential (primary) hypertension: Secondary | ICD-10-CM | POA: Diagnosis present

## 2024-04-25 DIAGNOSIS — Z7902 Long term (current) use of antithrombotics/antiplatelets: Secondary | ICD-10-CM

## 2024-04-25 DIAGNOSIS — Z87442 Personal history of urinary calculi: Secondary | ICD-10-CM

## 2024-04-25 DIAGNOSIS — Z803 Family history of malignant neoplasm of breast: Secondary | ICD-10-CM

## 2024-04-25 DIAGNOSIS — I639 Cerebral infarction, unspecified: Principal | ICD-10-CM | POA: Diagnosis present

## 2024-04-25 DIAGNOSIS — I452 Bifascicular block: Secondary | ICD-10-CM | POA: Diagnosis present

## 2024-04-25 DIAGNOSIS — Z87891 Personal history of nicotine dependence: Secondary | ICD-10-CM

## 2024-04-25 DIAGNOSIS — I493 Ventricular premature depolarization: Secondary | ICD-10-CM | POA: Diagnosis present

## 2024-04-25 DIAGNOSIS — Z7982 Long term (current) use of aspirin: Secondary | ICD-10-CM

## 2024-04-25 DIAGNOSIS — Z974 Presence of external hearing-aid: Secondary | ICD-10-CM

## 2024-04-25 DIAGNOSIS — Z9049 Acquired absence of other specified parts of digestive tract: Secondary | ICD-10-CM

## 2024-04-25 DIAGNOSIS — E876 Hypokalemia: Secondary | ICD-10-CM | POA: Diagnosis present

## 2024-04-25 DIAGNOSIS — Z79899 Other long term (current) drug therapy: Secondary | ICD-10-CM

## 2024-04-25 DIAGNOSIS — C49A0 Gastrointestinal stromal tumor, unspecified site: Secondary | ICD-10-CM | POA: Insufficient documentation

## 2024-04-25 DIAGNOSIS — Z9889 Other specified postprocedural states: Secondary | ICD-10-CM

## 2024-04-25 DIAGNOSIS — R29701 NIHSS score 1: Secondary | ICD-10-CM | POA: Diagnosis present

## 2024-04-25 DIAGNOSIS — E43 Unspecified severe protein-calorie malnutrition: Secondary | ICD-10-CM | POA: Diagnosis present

## 2024-04-25 DIAGNOSIS — E119 Type 2 diabetes mellitus without complications: Secondary | ICD-10-CM | POA: Diagnosis present

## 2024-04-25 LAB — CBG MONITORING, ED: Glucose-Capillary: 117 mg/dL — ABNORMAL HIGH (ref 70–99)

## 2024-04-25 MED ORDER — TENECTEPLASE 25 MG IV KIT
PACK | INTRAVENOUS | Status: AC
  Filled 2024-04-25: qty 5

## 2024-04-25 MED ORDER — SODIUM CHLORIDE 0.9% FLUSH
3.0000 mL | Freq: Once | INTRAVENOUS | Status: AC
  Administered 2024-04-26: 3 mL via INTRAVENOUS

## 2024-04-25 NOTE — ED Notes (Signed)
 Called to [employer] Per Charge RN Vanessa/Activate Code Stroke/Rep [employer].

## 2024-04-25 NOTE — ED Provider Notes (Signed)
 "  [facility] Provider Note    Event Date/Time   First MD Initiated Contact with Patient 04/25/24 2343     (approximate)   History   Chief Complaint: No chief complaint on file.   HPI  SUEO CULLEN is a 84 y.o. male ***        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    Has, does not wear often    Current Outpatient Rx   Order #: [id number] Class: Normal   Order #: [id number] Class: Normal   Order #: [id number] Class: Historical Med   Order #: [id number] Class: Normal   Order #: [id number] Class: Normal   Order #: [id number] Class: Historical Med   Order #: [id number] Class: Historical Med    Past Surgical History:  Procedure Laterality Date   APPENDECTOMY     During college   CATARACT EXTRACTION W/PHACO Right 01/19/2022   Procedure: CATARACT EXTRACTION PHACO AND INTRAOCULAR LENS PLACEMENT (IOC) RIGHT  14.57 01:17.8;  Surgeon: Jaye Fallow, MD;  Location[facility];  Service: Ophthalmology;  Laterality: Right;   CATARACT EXTRACTION W/PHACO Left 02/02/2022   Procedure: CATARACT EXTRACTION PHACO AND INTRAOCULAR LENS PLACEMENT (IOC) LEFT DIABETIC 9.24 01:07.7;  Surgeon: Jaye Fallow, MD;  Location: [facility];  Service: Ophthalmology;  Laterality: Left;   CHOLECYSTECTOMY  1970   COLONOSCOPY WITH PROPOFOL  N/A 09/22/2017   Procedure: COLONOSCOPY WITH PROPOFOL ;  Surgeon: Jinny Carmine, MD;  Location: [facility];  Service: Endoscopy;  Laterality: N/A;   ESOPHAGOGASTRODUODENOSCOPY (EGD) WITH PROPOFOL  N/A 09/22/2017   Procedure: ESOPHAGOGASTRODUODENOSCOPY (EGD) WITH PROPOFOL ;  Surgeon: Jinny Carmine, MD;  Location: [facility];  Service: Endoscopy;   Laterality: N/A;   HERNIA REPAIR Left 1970   POLYPECTOMY  09/22/2017   Procedure: POLYPECTOMY;  Surgeon: Jinny Carmine, MD;  Location: [facility];  Service: Endoscopy;;   Resection of esophageal cancer  2003   TONSILLECTOMY AND ADENOIDECTOMY     as a child   VASECTOMY      Physical Exam   Triage Vital Signs: ED Triage Vitals  Encounter Vitals Group     BP      Girls Systolic BP Percentile      Girls Diastolic BP Percentile      Boys Systolic BP Percentile      Boys Diastolic BP Percentile      Pulse      Resp      Temp      Temp src      SpO2      Weight      Height      Head Circumference      Peak Flow      Pain Score      Pain Loc      Pain Education      Exclude from Growth Chart     Most recent vital signs: There were no vitals filed for this visit.  General: Awake, no distress. *** CV:  Good peripheral perfusion. *** Resp:  Normal effort. *** Abd:  No distention. *** Other:  ***   ED Results / Procedures / Treatments   Labs (  all labs ordered are listed, but only abnormal results are displayed) Labs Reviewed  CBG MONITORING, ED - Abnormal; Notable for the following components:      Result Value   Glucose-Capillary 117 (*)    All other components within normal limits  PROTIME-INR  APTT  CBC  DIFFERENTIAL  COMPREHENSIVE METABOLIC PANEL WITH GFR  ETHANOL  I-STAT CREATININE, ED     EKG ***   RADIOLOGY ***   PROCEDURES:  Procedures   MEDICATIONS ORDERED IN ED: Medications  sodium chloride  flush (NS) 0.9 % injection 3 mL (has no administration in time range)     IMPRESSION / MDM / ASSESSMENT AND PLAN / ED COURSE  I reviewed the triage vital signs and the nursing notes.  DDx: ***  Patient's presentation is most consistent with {EM COPA:27473}  ***       FINAL CLINICAL IMPRESSION(S) / ED DIAGNOSES   Final diagnoses:  None     Rx / DC Orders   ED Discharge Orders     None        Note:  This document  was prepared using Dragon voice recognition software and may include unintentional dictation errors. "

## 2024-04-25 NOTE — ED Notes (Deleted)
 Called to [employer] Per Charge RN Vanessa/Activate Code/Rep [employer].

## 2024-04-26 ENCOUNTER — Emergency Department

## 2024-04-26 ENCOUNTER — Observation Stay

## 2024-04-26 ENCOUNTER — Observation Stay
Admit: 2024-04-26 | Discharge: 2024-04-26 | Disposition: A | Discharge status: Home / Self Care | Attending: Internal Medicine | Admitting: Internal Medicine

## 2024-04-26 DIAGNOSIS — I6340 Cerebral infarction due to embolism of unspecified cerebral artery: Secondary | ICD-10-CM

## 2024-04-26 DIAGNOSIS — Z7902 Long term (current) use of antithrombotics/antiplatelets: Secondary | ICD-10-CM | POA: Diagnosis not present

## 2024-04-26 DIAGNOSIS — I10 Essential (primary) hypertension: Secondary | ICD-10-CM | POA: Diagnosis present

## 2024-04-26 DIAGNOSIS — Z7982 Long term (current) use of aspirin: Secondary | ICD-10-CM | POA: Diagnosis not present

## 2024-04-26 DIAGNOSIS — E785 Hyperlipidemia, unspecified: Secondary | ICD-10-CM | POA: Diagnosis present

## 2024-04-26 DIAGNOSIS — R29701 NIHSS score 1: Secondary | ICD-10-CM | POA: Diagnosis present

## 2024-04-26 DIAGNOSIS — Z87442 Personal history of urinary calculi: Secondary | ICD-10-CM | POA: Diagnosis not present

## 2024-04-26 DIAGNOSIS — Z87891 Personal history of nicotine dependence: Secondary | ICD-10-CM | POA: Diagnosis not present

## 2024-04-26 DIAGNOSIS — Z803 Family history of malignant neoplasm of breast: Secondary | ICD-10-CM | POA: Diagnosis not present

## 2024-04-26 DIAGNOSIS — Z87898 Personal history of other specified conditions: Secondary | ICD-10-CM

## 2024-04-26 DIAGNOSIS — Z681 Body mass index (BMI) 19 or less, adult: Secondary | ICD-10-CM

## 2024-04-26 DIAGNOSIS — Z8501 Personal history of malignant neoplasm of esophagus: Secondary | ICD-10-CM | POA: Diagnosis not present

## 2024-04-26 DIAGNOSIS — E43 Unspecified severe protein-calorie malnutrition: Secondary | ICD-10-CM | POA: Diagnosis present

## 2024-04-26 DIAGNOSIS — C49A0 Gastrointestinal stromal tumor, unspecified site: Secondary | ICD-10-CM | POA: Insufficient documentation

## 2024-04-26 DIAGNOSIS — R471 Dysarthria and anarthria: Secondary | ICD-10-CM | POA: Diagnosis present

## 2024-04-26 DIAGNOSIS — Z79899 Other long term (current) drug therapy: Secondary | ICD-10-CM | POA: Diagnosis not present

## 2024-04-26 DIAGNOSIS — I493 Ventricular premature depolarization: Secondary | ICD-10-CM | POA: Diagnosis present

## 2024-04-26 DIAGNOSIS — Z9889 Other specified postprocedural states: Secondary | ICD-10-CM

## 2024-04-26 DIAGNOSIS — E119 Type 2 diabetes mellitus without complications: Secondary | ICD-10-CM | POA: Diagnosis present

## 2024-04-26 DIAGNOSIS — I452 Bifascicular block: Secondary | ICD-10-CM | POA: Diagnosis present

## 2024-04-26 DIAGNOSIS — E876 Hypokalemia: Secondary | ICD-10-CM | POA: Diagnosis present

## 2024-04-26 DIAGNOSIS — Z9049 Acquired absence of other specified parts of digestive tract: Secondary | ICD-10-CM | POA: Diagnosis not present

## 2024-04-26 DIAGNOSIS — R4701 Aphasia: Secondary | ICD-10-CM | POA: Diagnosis present

## 2024-04-26 DIAGNOSIS — I441 Atrioventricular block, second degree: Secondary | ICD-10-CM | POA: Insufficient documentation

## 2024-04-26 DIAGNOSIS — I639 Cerebral infarction, unspecified: Secondary | ICD-10-CM | POA: Diagnosis present

## 2024-04-26 DIAGNOSIS — Z974 Presence of external hearing-aid: Secondary | ICD-10-CM | POA: Diagnosis not present

## 2024-04-26 LAB — LIPID PANEL
Cholesterol: 130 mg/dL (ref 0–200)
HDL: 48 mg/dL
LDL Cholesterol: 71 mg/dL (ref 0–99)
Total CHOL/HDL Ratio: 2.7 ratio
Triglycerides: 52 mg/dL
VLDL: 10 mg/dL (ref 0–40)

## 2024-04-26 LAB — COMPREHENSIVE METABOLIC PANEL WITH GFR
ALT: 19 U/L (ref 0–44)
AST: 24 U/L (ref 15–41)
Albumin: 4.3 g/dL (ref 3.5–5.0)
Alkaline Phosphatase: 112 U/L (ref 38–126)
Anion gap: 13 (ref 5–15)
BUN: 27 mg/dL — ABNORMAL HIGH (ref 8–23)
CO2: 23 mmol/L (ref 22–32)
Calcium: 9.1 mg/dL (ref 8.9–10.3)
Chloride: 106 mmol/L (ref 98–111)
Creatinine, Ser: 1.01 mg/dL (ref 0.61–1.24)
GFR, Estimated: >60 mL/min
Glucose, Bld: 151 mg/dL — ABNORMAL HIGH (ref 70–99)
Potassium: 3.3 mmol/L — ABNORMAL LOW (ref 3.5–5.1)
Sodium: 141 mmol/L (ref 135–145)
Total Bilirubin: 1.6 mg/dL — ABNORMAL HIGH (ref 0.0–1.2)
Total Protein: 6.8 g/dL (ref 6.5–8.1)

## 2024-04-26 LAB — PROTIME-INR
INR: 1.1 (ref 0.8–1.2)
Prothrombin Time: 14.4 s (ref 11.4–15.2)

## 2024-04-26 LAB — CBC
HCT: 47.7 % (ref 39.0–52.0)
Hemoglobin: 16.7 g/dL (ref 13.0–17.0)
MCH: 32.3 pg (ref 26.0–34.0)
MCHC: 35 g/dL (ref 30.0–36.0)
MCV: 92.3 fL (ref 80.0–100.0)
Platelets: 176 10*3/uL (ref 150–400)
RBC: 5.17 MIL/uL (ref 4.22–5.81)
RDW: 12.7 % (ref 11.5–15.5)
WBC: 8.3 10*3/uL (ref 4.0–10.5)
nRBC: 0 % (ref 0.0–0.2)

## 2024-04-26 LAB — DIFFERENTIAL
Abs Immature Granulocytes: 0.02 10*3/uL (ref 0.00–0.07)
Basophils Absolute: 0 10*3/uL (ref 0.0–0.1)
Basophils Relative: 1 %
Eosinophils Absolute: 0.2 10*3/uL (ref 0.0–0.5)
Eosinophils Relative: 3 %
Immature Granulocytes: 0 %
Lymphocytes Relative: 28 %
Lymphs Abs: 2.3 10*3/uL (ref 0.7–4.0)
Monocytes Absolute: 0.6 10*3/uL (ref 0.1–1.0)
Monocytes Relative: 8 %
Neutro Abs: 5.1 10*3/uL (ref 1.7–7.7)
Neutrophils Relative %: 60 %

## 2024-04-26 LAB — APTT: aPTT: 28 s (ref 24–36)

## 2024-04-26 LAB — T4, FREE: Free T4: 1.35 ng/dL (ref 0.80–2.00)

## 2024-04-26 LAB — GLUCOSE, CAPILLARY: Glucose-Capillary: 161 mg/dL — ABNORMAL HIGH (ref 70–99)

## 2024-04-26 LAB — HEMOGLOBIN A1C
Hgb A1c MFr Bld: 5.7 % — ABNORMAL HIGH (ref 4.8–5.6)
Mean Plasma Glucose: 116.89 mg/dL

## 2024-04-26 LAB — ECHOCARDIOGRAM COMPLETE BUBBLE STUDY: S' Lateral: 2.01 cm

## 2024-04-26 LAB — ETHANOL: Alcohol, Ethyl (B): <15 mg/dL

## 2024-04-26 LAB — TSH: TSH: 2.59 u[IU]/mL (ref 0.350–4.500)

## 2024-04-26 MED ORDER — STROKE: EARLY STAGES OF RECOVERY BOOK: Freq: Once | Status: AC

## 2024-04-26 MED ORDER — SODIUM CHLORIDE 0.9 % IV SOLN: INTRAVENOUS | Status: DC

## 2024-04-26 MED ORDER — CLOPIDOGREL BISULFATE 75 MG PO TABS
75.0000 mg | ORAL_TABLET | Freq: Once | ORAL | Status: AC
  Administered 2024-04-26: 75 mg via ORAL
  Filled 2024-04-26: qty 1

## 2024-04-26 MED ORDER — IOHEXOL 350 MG/ML SOLN
125.0000 mL | Freq: Once | INTRAVENOUS | Status: AC | PRN
  Administered 2024-04-26: 125 mL via INTRAVENOUS

## 2024-04-26 MED ORDER — ACETAMINOPHEN 160 MG/5ML PO SOLN: 650.0000 mg | ORAL | Status: DC | PRN

## 2024-04-26 MED ORDER — ADULT MULTIVITAMIN W/MINERALS CH
1.0000 | ORAL_TABLET | Freq: Every day | ORAL | Status: DC
  Administered 2024-04-26 – 2024-04-27 (×2): 1 via ORAL
  Filled 2024-04-26 (×2): qty 1

## 2024-04-26 MED ORDER — ASPIRIN 81 MG PO TBEC
81.0000 mg | DELAYED_RELEASE_TABLET | Freq: Every day | ORAL | Status: DC
  Administered 2024-04-27: 81 mg via ORAL
  Filled 2024-04-26: qty 1

## 2024-04-26 MED ORDER — ENSURE PLUS HIGH PROTEIN PO LIQD
237.0000 mL | Freq: Two times a day (BID) | ORAL | Status: DC
  Administered 2024-04-26 – 2024-04-27 (×3): 237 mL via ORAL

## 2024-04-26 MED ORDER — ATORVASTATIN CALCIUM 20 MG PO TABS
40.0000 mg | ORAL_TABLET | Freq: Every evening | ORAL | Status: DC
  Administered 2024-04-26: 40 mg via ORAL
  Filled 2024-04-26: qty 2

## 2024-04-26 MED ORDER — CLOPIDOGREL BISULFATE 75 MG PO TABS
75.0000 mg | ORAL_TABLET | Freq: Every day | ORAL | Status: DC
  Administered 2024-04-27: 75 mg via ORAL
  Filled 2024-04-26: qty 1

## 2024-04-26 MED ORDER — ACETAMINOPHEN 650 MG RE SUPP: 650.0000 mg | RECTAL | Status: DC | PRN

## 2024-04-26 MED ORDER — ALUM & MAG HYDROXIDE-SIMETH 200-200-20 MG/5ML PO SUSP
30.0000 mL | ORAL | Status: DC | PRN
  Administered 2024-04-26: 30 mL via ORAL
  Filled 2024-04-26: qty 30

## 2024-04-26 MED ORDER — ENOXAPARIN SODIUM 40 MG/0.4ML IJ SOSY
40.0000 mg | PREFILLED_SYRINGE | INTRAMUSCULAR | Status: DC
  Administered 2024-04-26 – 2024-04-27 (×2): 40 mg via SUBCUTANEOUS
  Filled 2024-04-26 (×2): qty 0.4

## 2024-04-26 MED ORDER — ACETAMINOPHEN 325 MG PO TABS: 650.0000 mg | ORAL_TABLET | ORAL | Status: DC | PRN

## 2024-04-26 MED ORDER — CLOPIDOGREL BISULFATE 75 MG PO TABS
225.0000 mg | ORAL_TABLET | Freq: Once | ORAL | Status: AC
  Administered 2024-04-26: 225 mg via ORAL
  Filled 2024-04-26: qty 3

## 2024-04-26 MED ORDER — THIAMINE MONONITRATE 100 MG PO TABS
100.0000 mg | ORAL_TABLET | Freq: Every day | ORAL | Status: DC
  Administered 2024-04-27: 100 mg via ORAL
  Filled 2024-04-26: qty 1

## 2024-04-26 MED ORDER — SENNOSIDES-DOCUSATE SODIUM 8.6-50 MG PO TABS: 1.0000 | ORAL_TABLET | Freq: Every evening | ORAL | Status: DC | PRN

## 2024-04-26 MED ORDER — PANTOPRAZOLE SODIUM 40 MG PO TBEC
40.0000 mg | DELAYED_RELEASE_TABLET | Freq: Every day | ORAL | Status: DC
  Administered 2024-04-26 – 2024-04-27 (×2): 40 mg via ORAL
  Filled 2024-04-26 (×2): qty 1

## 2024-04-26 MED ORDER — ASPIRIN 81 MG PO CHEW
324.0000 mg | CHEWABLE_TABLET | Freq: Once | ORAL | Status: AC
  Administered 2024-04-26: 324 mg via ORAL
  Filled 2024-04-26: qty 4

## 2024-04-26 NOTE — Plan of Care (Signed)
  Problem: Education: Goal: Knowledge of secondary prevention will improve (MUST DOCUMENT ALL) Outcome: Progressing   Problem: Ischemic Stroke/TIA Tissue Perfusion: Goal: Complications of ischemic stroke/TIA will be minimized Outcome: Progressing   Problem: Health Behavior/Discharge Planning: Goal: Goals will be collaboratively established with patient/family Outcome: Progressing   Problem: Nutrition: Goal: Dietary intake will improve Outcome: Progressing   Problem: Clinical Measurements: Goal: Diagnostic test results will improve Outcome: Progressing

## 2024-04-26 NOTE — Assessment & Plan Note (Signed)
 Suspect protein calorie malnutrition BMI 15.89 Can consider dietary consult

## 2024-04-26 NOTE — Consult Note (Addendum)
 [facility] Consult Services   Patient Name:   [name] Date of Birth:   06/08/1940 Identification Number:   MRN - [id number] Date of Service:   04/25/2024 23:42:55  Diagnosis:       I63.89 - Cerebrovascular accident (CVA) due to other mechanism [facility])  Impression:      The patient presents with transient aphasia for 2 minutes followed by slurred speech that is also significantly improved. At present NIHSS of 2 for orientation questions and subtle dysarthria. Given the low stroke score I discussed with him and his wife and patient stated that currently there are no disabling deficits. Thus I will defer IV thrombolytics. Risks and benefits were discussed regarding IV thrombolysis in these cases where currently symptoms are mild. Everyone was in agreement with holding off. CTH was negative for acute findings. Stat CTA H/N are pending to rule out LVO. Otherwise this could be small stroke with recovery or TIA, and will admit for stroke workup.    ADDENDUM:  Advanced Imaging: CTA Head and Neck Completed.  CT perfusion Completed.  LVO:No  Patient is not eligible for NIR consideration. Proceed with plan as discussed.  Our recommendations are outlined below.  Recommendations:        Stroke/Telemetry Floor       Neuro Checks       Bedside Swallow Eval       DVT Prophylaxis       IV Fluids, Normal Saline       Head of Bed 30 Degrees       Euglycemia and Avoid Hyperthermia (PRN Acetaminophen )       Bolus with Clopidogrel  300 mg bolus x1 and initiate dual antiplatelet therapy with Aspirin  81 mg daily and Clopidogrel  75 mg daily       Antihypertensives PRN if Blood pressure is greater than 220/120 or there is a concern for End organ damage/contraindications for permissive HTN. If blood pressure is greater than 220/120 give labetalol PO or IV or Vasotec IV with a goal of 15% reduction in BP during the first 24 hours.       admit for further stroke  workup.  Sign Out:       Discussed with Emergency Department Provider    ------------------------------------------------------------------------------  Advanced Imaging: Advanced imaging has been ordered. Results pending.   Metrics: Last Known Well: 04/25/2024 22:00:00 Arrival Time: 04/25/2024 23:26:00 Activation Time: 04/25/2024 23:42:55 Initial Response Time: 04/25/2024 23:44:50 Symptoms: transient aphasia, slurred speech and dizziness. Initial patient interaction: 04/25/2024 23:49:14 NIHSS Assessment Completed: 04/25/2024 23:54:25 Patient is not a candidate for Thrombolytic. Thrombolytic Medical Decision: 04/25/2024 23:57:58 Patient was not deemed candidate for Thrombolytic because of following reasons: Resolved symptoms . Stroke severity too mild (non-disabling) .  CT Head: I personally reviewed all the CT images that were available to me and it showed: No Acute Hemorrhage or Acute Core Infarct  Primary Provider Notified of Diagnostic Impression and Management Plan on: 04/26/2024 00:08:11    ------------------------------------------------------------------------------  History of Present Illness: Patient is a 84 year old Male.  Patient was brought by private transportation with symptoms of transient aphasia, slurred speech and dizziness. LKWT 10pm. He developed aphasia and that resolved but has continued with slurred speech. His wife stated He turned to her and said I can't talk and had loss of words for only 2 minutes and then was slurring his words badly but it quickly got a lot better. She tested his arms and smile and it was normal but decided to  come in and get checked out. Currently he feels tongue is thick, like he has a lisp. He has HTN and a h/o esophageal cancer 20 years ago. He was having dizziness initially but He came in the front door, was able to walk in.   Past Medical History:      Hypertension Other PMH:  kidney stones  esophageal cancer  Hep  B  TMJ syndrome  Medications:  No Anticoagulant use  No Antiplatelet use Reviewed EMR for current medications  Allergies:  Reviewed  Social History: Smoking: No  Family History:  There is no family history of premature cerebrovascular disease pertinent to this consultation  ROS : 14 Points Review of Systems was performed and was negative except mentioned in HPI.  Past Surgical History: There Is No Surgical History Contributory To Todays Visit    Examination: BP(164/104), Pulse(120), Blood Glucose(117) 1A: Level of Consciousness - Alert; keenly responsive + 0 1B: Ask Month and Age - 1 Question Right + 1 1C: Blink Eyes & Squeeze Hands - Performs Both Tasks + 0 2: Test Horizontal Extraocular Movements - Normal + 0 3: Test Visual Fields - No Visual Loss + 0 4: Test Facial Palsy (Use Grimace if Obtunded) - Normal symmetry + 0 5A: Test Left Arm Motor Drift - No Drift for 10 Seconds + 0 5B: Test Right Arm Motor Drift - No Drift for 10 Seconds + 0 6A: Test Left Leg Motor Drift - No Drift for 5 Seconds + 0 6B: Test Right Leg Motor Drift - No Drift for 5 Seconds + 0 7: Test Limb Ataxia (FNF/Heel-Shin) - No Ataxia + 0 8: Test Sensation - Normal; No sensory loss + 0 9: Test Language/Aphasia - Normal; No aphasia + 0 10: Test Dysarthria - Mild-Moderate Dysarthria: Slurring but can be understood + 1 11: Test Extinction/Inattention - No abnormality + 0  NIHSS Score: 2  NIHSS Free Text : he said the current month is september but knows correct year and his age  There is no aphasia, speaks fluently and reads well. There may be subtle dysarthria.  Pre-Morbid Modified Rankin Scale: 0 Points = No symptoms at all  Spoke with : Dr Viviann I reviewed the available imaging via Rapid and initiated discussion with the primary provider  This consult was conducted in real time using interactive audio and video technology. Patient was informed of the technology being used for this visit  and agreed to proceed. Patient located in hospital and provider located at home/office setting.   Patient is being evaluated for possible acute neurologic impairment and high probability of imminent or life-threatening deterioration. I spent total of 35 minutes providing care to this patient, including time for face to face visit via telemedicine, review of medical records, imaging studies and discussion of findings with providers, the patient and/or family.    Dr Daril Door   [facility] For Inpatient follow-up with [facility] physician please call [facility] at [phone]. As we are not an outpatient service for any post hospital discharge needs please contact the hospital for assistance. If you have any questions for the [facility] physicians or need to reconsult for clinical or diagnostic changes please contact us  via [facility] at [phone].  Non-radiologist review of imaging performed to assist with emergent clinical decision-making. Remote physician workstations do not possess the same resolution, calibration, or diagnostic capabilities as hospital-based radiology reading stations, and formal radiologist read is necessary.   Signature : Daril Door

## 2024-04-26 NOTE — Evaluation (Signed)
 Occupational Therapy Evaluation Patient Details Name: [name] MRN: [id number] DOB: [birth year] Today's Date: 04/26/2024   History of Present Illness   Pt is an 84 y.o. male with medical history significant for GIST s/p transhiatal esophagogastrectomy (2003), esophageal stricture s/p dilatation, HTN, nephrolithiasis, recently evaluated by cardiology for palpitations-Holter showing predominant sinus bradycardia/type I second-degree AV block/occasional PVCs and PACs, being admitted for stroke workup after presenting as a code stroke.  Patient had onset of aphasia and slurred speech starting an hour prior to arrival associated with dizziness and generalized weakness. 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    Clinical Impressions Pt seen for OT evaluation this date. Prior to hospital admission, pt was active and independent in all aspects of ADL/IADL and enjoyed walking 2-29miles 6x/wk. Pt lives with his spouse in a 1 story home with level entry. Currently pt reporting speech symptoms have nearly resolved. Pt demonstrates baseline independence to perform ADL and mobility tasks and no strength, sensory, coordination, cognitive, or visual deficits appreciated with assessment. No skilled OT needs identified. Will sign off. Please re-consult if additional OT needs arise.    If plan is discharge home, recommend the following:         Functional Status Assessment   Patient has not had a recent decline in their functional status     Equipment Recommendations   None recommended by OT     Recommendations for Other Services         Precautions/Restrictions   Precautions Precautions: None Restrictions Weight Bearing Restrictions Per Provider Order: No     Mobility Bed  Mobility Overal bed mobility: Modified Independent                  Transfers Overall transfer level: Needs assistance   Transfers: Sit to/from Stand Sit to Stand: Supervision                  Balance Overall balance assessment: Needs assistance Sitting-balance support: No upper extremity supported, Feet supported Sitting balance-Leahy Scale: Normal     Standing balance support: No upper extremity supported, During functional activity Standing balance-Leahy Scale: Good                             ADL either performed or assessed with clinical judgement   ADL Overall ADL's : Modified independent                                             Vision         Perception         Praxis         Pertinent Vitals/Pain Pain Assessment Pain Assessment: No/denies pain     Extremity/Trunk Assessment Upper Extremity Assessment Upper Extremity Assessment: Overall WFL for tasks assessed   Lower Extremity Assessment Lower Extremity Assessment: Overall WFL for tasks assessed   Cervical / Trunk Assessment Cervical / Trunk Assessment: Normal   Communication Communication Communication: No apparent difficulties   Cognition Arousal: Alert Behavior During Therapy: WFL for tasks assessed/performed Cognition: No apparent impairments  Following commands: Intact       Cueing  General Comments   Cueing Techniques: Verbal cues      Exercises     Shoulder Instructions      Home Living Family/patient expects to be discharged to:: Private residence Living Arrangements: Spouse/significant other   Type of Home: House Home Access: Level entry     Home Layout: One level     Bathroom Shower/Tub: Walk-in shower         Home Equipment: Hand held shower head;Shower seat - built in          Prior Functioning/Environment Prior Level of Function : Independent/Modified  Independent;Driving             Mobility Comments: walks 2-25miles 6x a week      OT Problem List: Other (comment) (improving speech defciits)   OT Treatment/Interventions:        OT Goals(Current goals can be found in the care plan section)   Acute Rehab OT Goals Patient Stated Goal: remain independent OT Goal Formulation: All assessment and education complete, DC therapy   OT Frequency:       Co-evaluation              AM-PAC OT 6 Clicks Daily Activity     Outcome Measure Help from another person eating meals?: None Help from another person taking care of personal grooming?: None Help from another person toileting, which includes using toliet, bedpan, or urinal?: None Help from another person bathing (including washing, rinsing, drying)?: None Help from another person to put on and taking off regular upper body clothing?: None Help from another person to put on and taking off regular lower body clothing?: None 6 Click Score: 24   End of Session Equipment Utilized During Treatment: Gait belt  Activity Tolerance: Patient tolerated treatment well Patient left: in bed;with call bell/phone within reach  OT Visit Diagnosis: Cognitive communication deficit (R41.841) Symptoms and signs involving cognitive functions: Cerebral infarction                Time: 9140-9089 OT Time Calculation (min): 11 min Charges:  OT General Charges $OT Visit: 1 Visit OT Evaluation $OT Eval Low Complexity: 1 Low  Warren SAUNDERS., MPH, MS, OTR/L ascom [phone] 04/26/24, 10:22 AM

## 2024-04-26 NOTE — TOC Initial Note (Signed)
 Transition of Care [facility]) - Initial/Assessment Note    Patient Details  Name: [name] MRN: [id number] Date of Birth: 01-Jul-1940  Transition of Care [facility]) CM/SW Contact:    Nathanael CHRISTELLA Ring, RN Phone Number: 04/26/2024, 11:35 AM  Clinical Narrative:                  CM met with patient at the bedside, wife sitting in bedside recliner.  He is alert and oriented.  CM introduces self and explains role in DC planning.  He is from home with his wife, independent and walks 2-3 miles per day in the park.  He drives and his wife also drives.  He is current with his PCP and uses [facility] for prescriptions.  Therapy has already worked with him this morning and no PT or OT follow up needed.    Expected Discharge Plan: Home/Self Care Barriers to Discharge: Continued Medical Work up   Patient Goals and CMS Choice Patient states their goals for this hospitalization and ongoing recovery are:: To go back home          Expected Discharge Plan and Services       Living arrangements for the past 2 months: Apartment                 DME Arranged: N/A         HH Arranged: NA          Prior Living Arrangements/Services Living arrangements for the past 2 months: Apartment Lives with:: Spouse Patient language and need for interpreter reviewed:: Yes Do you feel safe going back to the place where you live?: Yes      Need for Family Participation in Patient Care: Yes (Comment) Care giver support system in place?: Yes (comment)   Criminal Activity/Legal Involvement Pertinent to Current Situation/Hospitalization: No - Comment as needed  Activities of Daily Living      Permission Sought/Granted Permission sought to share information with : Family Supports Permission granted to share information with : Yes, Verbal Permission Granted  Share Information with NAME: Joanne Aven     Permission granted to share info w Relationship: spouse  Permission granted to share info w Contact  Information: [phone]  Emotional Assessment Appearance:: Appears stated age Attitude/Demeanor/Rapport: Engaged Affect (typically observed): Accepting Orientation: : Oriented to Self, Oriented to Place, Oriented to  Time, Oriented to Situation Alcohol / Substance Use: Not Applicable Psych Involvement: No (comment)  Admission diagnosis:  Acute ischemic stroke [facility]) [I63.9] Acute CVA (cerebrovascular accident) [facility]) [I63.9] Patient Active Problem List   Diagnosis Date Noted   Acute CVA (cerebrovascular accident) (HCC) 04/26/2024   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) 04/26/2024   History of esophageal dilatation 2012 04/26/2024   Second degree atrioventricular block, Mobitz (type) I 04/26/2024   History of palpitations 04/26/2024   Underweight (BMI < 18.5) 04/26/2024   Epigastric pain    Colon cancer screening    Benign neoplasm of ascending colon    H/O type B viral hepatitis 05/01/2015   Hernia, inguinal, right 12/30/2013   Malignant neoplasm of gastrointestinal tract (HCC) 11/09/2011   Hypertension 08/03/2007   PCP:  Bertrum Charlie CROME, MD Pharmacy:   [id number] [place], [place] - [address] AT [facility] OF [address] & [address] [place] [address] Phone: [phone] Fax: [phone]  [facility] - [place], [place] - [address] [place]  [place] [address] Phone: [phone] Fax: [phone]  [facility] - [place], [place] - [address] [place]  [address] Phone: [phone] Fax: [phone]     Social Drivers of Health (SDOH) Social History: SDOH Screenings   Food Insecurity: No Food Insecurity (01/31/2024)   Received from [facility]  Housing: Unknown (01/31/2024)   Received from [facility]  Transportation Needs: No Transportation Needs  (01/31/2024)   Received from [facility]  Utilities: Not At Risk (01/31/2024)   Received from [facility]  Alcohol Screen: Low Risk (07/07/2021)  Depression (PHQ2-9): Low Risk (07/07/2021)  Financial Resource Strain: Low Risk  (01/31/2024)   Received from [facility]  Physical Activity: Sufficiently Active (12/17/2022)   Received from [facility]  Tobacco Use: Medium Risk (02/27/2024)   Received from [facility]  Health Literacy: Adequate Health Literacy (12/17/2022)   Received from [facility]   SDOH Interventions:     Readmission Risk Interventions     No data to display

## 2024-04-26 NOTE — Progress Notes (Signed)
 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  GOAL:   Patient will meet greater than or equal to 90% of their needs  MONITOR:   PO intake, Supplement acceptance  REASON FOR ASSESSMENT:   Consult Assessment of nutrition requirement/status  ASSESSMENT:   84 y.o. male with medical history significant for GIST s/p transhiatal esophagogastrectomy (2003), esophageal stricture s/p dilatation, HTN, nephrolithiasis, recently evaluated by cardiology for palpitations-Holter showing predominant sinus bradycardia/type I second-degree AV block/occasional PVCs and PACs, being admitted for stroke workup after presenting as a code stroke.  Patient had onset of aphasia and slurred speech starting an hour prior to arrival associated with dizziness and generalized weakness.  Patient admitted with acute CVA.   Reviewed I/O's: 14.6 ml x 24 hours  Per H&P, CT of head and neck revealed moderate stenosis right carotid siphon.   Spoke with patient and wife at bedside, who were pleasant and in good spirits today. Both were eager to engage RD in conversation. Patient reports he had a poor appetite over the past few days secondary to stress, which is typical for him (I don't stress eat, I do the opposite). He  reports feeling better and was consuming breakfast and eggs and pancakes without difficulty. Noted meal completions 85%.   Patient explains that he is not a big eater secondary to history of esophagogastrectomy over 20 years ago. Initially, he reports the surgery caused him to lose a fair amount of weight, but reports he has maintained weight of 140# over the past several years. Per patient, he is very active and often walks 2-3 miles 5-6 times daily at a local park. Reviewed weight history; patient has experienced a 23.1% weight loss over the past 4 months, which is significant for time frame.   Patient reports his diet is fairly regimented: he consumes 3 meals per day (Breakfast: oatmeal,  hot tea, and banana; Lunch: [employer] sub and frito's; Dinner: homemade soup and corn bread). Patient also cosnumes 1 Premier Protein shake with he doctors up with peanut butter and berries.   Discussed importance of good meal and supplement intake to promote healing. Patient amenable to supplements. RD will also liberalize diet for widest variety of meal selections.   Per TOC notes, plan to discharge home when medically stable.   Medications reviewed and include plavix , lovenox , and 0.9% sodium chloride  infusion @ 40 ml/hr.   Labs reviewed: K: 3.3. CBGS: 117.   NUTRITION - FOCUSED PHYSICAL EXAM:  Flowsheet Row Most Recent Value  Orbital Region Mild depletion  Upper Arm Region Moderate depletion  Thoracic and Lumbar Region Moderate depletion  Buccal Region Moderate depletion  Temple Region Moderate depletion  Clavicle Bone Region Severe depletion  Clavicle and Acromion Bone Region Severe depletion  Scapular Bone Region Severe depletion  Dorsal Hand Moderate depletion  Patellar Region Severe depletion  Anterior Thigh Region Severe depletion  Posterior Calf Region Severe depletion  Edema (RD Assessment) None  Hair Reviewed  Eyes Reviewed  Mouth Reviewed  Skin Reviewed  Nails Reviewed     Diet Order:   Diet Order             Diet NPO time specified Except for: Sips with Meds  Diet effective midnight           Diet regular Fluid consistency: Thin  Diet effective now                   EDUCATION NEEDS:   Education needs have been addressed  Skin:  Skin Assessment: Reviewed RN Assessment  Last BM:  04/25/24  Height:   Ht Readings from Last 1 Encounters:  04/25/24 5' 9 (1.753 m)    Weight:   Wt Readings from Last 1 Encounters:  04/26/24 48.8 kg    Ideal Body Weight:  72.7 kg  BMI:  Body mass index is 15.89 kg/m.  Estimated Nutritional Needs:   Kcal:  1950-2150  Protein:  100-115 grams  Fluid:  1.9-2.1 L    Margery ORN, RD, LDN, CDCES Registered Dietitian III Certified Diabetes Care and Education Specialist If unable to reach this RD, please use RD Inpatient group chat on secure chat between hours of 8am-4 pm daily

## 2024-04-26 NOTE — Assessment & Plan Note (Addendum)
 History of palpitations Evaluated by cardiology December 2025 S/p Holter 01/2024 revealed predominant sinus bradycardia, occasional PVCs and PACs, and occasional dropped beats due to type I second-degree AV block  Continuous cardiac monitoring

## 2024-04-26 NOTE — H&P (Signed)
 " History and Physical    Patient: [name] FMW:[id number] DOB: [birth year] DOA: 04/25/2024 DOS: the patient was seen and examined on 04/26/2024 PCP: Bertrum Charlie CROME, MD  Patient coming from: Home  Chief Complaint:  Chief Complaint  Patient presents with   Code Stroke    HPI: [name] is a 84 y.o. male with medical history significant for GIST s/p transhiatal esophagogastrectomy (2003), esophageal stricture s/p dilatation, HTN, nephrolithiasis, recently evaluated by cardiology for palpitations-Holter showing predominant sinus bradycardia/type I second-degree AV block/occasional PVCs and PACs, being admitted for stroke workup after presenting as a code stroke.  Patient had onset of aphasia and slurred speech starting an hour prior to arrival associated with dizziness and generalized weakness. Code stroke was activated upon arrival.  CT head was nonacute And CT head and neck negative for LVO or other emergent finding.  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 He was seen in consultation by teleneurologist with an NIHSS of 2 for subtle dysarthria and as such thrombolytics were not indicated due to nondisabling deficits. Recommendation for clopidogrel  300 mg bolus and initiation of dual antiplatelet therapy with aspirin  and Plavix  and admission for stroke workup Additional ED workup: Vitals with BP 164/104 otherwise unremarkable Labs notable for mild hypokalemia otherwise normal CBC, CMP INR and EtOH EKG showing sinus at 85 with incomplete RBBB and LAFB and prolonged QT  Admission requested      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    Has, does not wear often   Past Surgical History:   Procedure Laterality Date   APPENDECTOMY     During college   CATARACT EXTRACTION W/PHACO Right 01/19/2022   Procedure: CATARACT EXTRACTION PHACO AND INTRAOCULAR LENS PLACEMENT (IOC) RIGHT  14.57 01:17.8;  Surgeon: Jaye Fallow, MD;  Location: [facility];  Service: Ophthalmology;  Laterality: Right;   CATARACT EXTRACTION W/PHACO Left 02/02/2022   Procedure: CATARACT EXTRACTION PHACO AND INTRAOCULAR LENS PLACEMENT (IOC) LEFT DIABETIC 9.24 01:07.7;  Surgeon: Jaye Fallow, MD;  Location: [facility];  Service: Ophthalmology;  Laterality: Left;   CHOLECYSTECTOMY  1970   COLONOSCOPY WITH PROPOFOL  N/A 09/22/2017   Procedure: COLONOSCOPY WITH PROPOFOL ;  Surgeon: Jinny Carmine, MD;  Location: [facility];  Service: Endoscopy;  Laterality: N/A;   ESOPHAGOGASTRODUODENOSCOPY (EGD) WITH PROPOFOL  N/A 09/22/2017   Procedure: ESOPHAGOGASTRODUODENOSCOPY (EGD) WITH PROPOFOL ;  Surgeon: Jinny Carmine, MD;  Location: [facility];  Service: Endoscopy;  Laterality: N/A;   HERNIA REPAIR Left 1970   POLYPECTOMY  09/22/2017   Procedure: POLYPECTOMY;  Surgeon: Jinny Carmine, MD;  Location: [facility];  Service: Endoscopy;;   Resection of esophageal cancer  2003   TONSILLECTOMY AND ADENOIDECTOMY     as a child   VASECTOMY     Social History:  reports that he quit smoking about 52 years ago. His smoking use included cigarettes. He started smoking about 62 years ago. He has a 20 pack-year smoking history. He has never used smokeless tobacco. He reports that he does not drink alcohol and does not use drugs.  Allergies[1]  Family History  Problem Relation Age of Onset  Cancer Sister        Breast cancer    Prior to Admission medications  Medication Sig Start Date End Date Taking? Authorizing Provider  amLODipine -benazepril  (LOTREL) 5-20 MG capsule TAKE 1 CAPSULE BY MOUTH DAILY 07/15/22   Gasper Nancyann BRAVO, MD  azithromycin  (ZITHROMAX ) 250 MG tablet Take 1 tablet (250  mg total) by mouth daily. Take first 2 tablets together, then 1 every day until finished. 02/15/23   White, Shelba SAUNDERS, NP  Cyanocobalamin (B-12 SL) Place under the tongue daily.    [provider]  metoprolol  succinate (TOPROL -XL) 50 MG 24 hr tablet TAKE 1 TABLET EVERY DAY, TAKE WITH OR IMMEDIATELY FOLLOWING A MEAL. NEED TO SCHEDULE FOLLOW UP WITH DR BERTRUM 06/28/22   Gasper Nancyann BRAVO, MD  oseltamivir  (TAMIFLU ) 75 MG capsule Take 1 capsule (75 mg total) by mouth every 12 (twelve) hours. 02/27/22   Corlis Burnard DEL, NP  OVER THE COUNTER MEDICATION daily. Morning kick (powder) by [facility]    [provider]  Vitamin D-Vitamin K (K2 PLUS D3 PO) Take by mouth daily.    [provider]    Physical Exam: Vitals:   04/25/24 2356 04/26/24 0019 04/26/24 0022 04/26/24 0100  BP: (!) 164/104 (!) 155/103  (!) 147/87  Pulse: (!) 120 87  88  Resp: 18 15  19   Temp:  98.2 F (36.8 C)    TempSrc:  Oral    SpO2: 97% 98%  94%  Weight:   48.8 kg   Height:       Physical Exam Vitals and nursing note reviewed.  Constitutional:      General: He is not in acute distress. HENT:     Head: Normocephalic and atraumatic.  Cardiovascular:     Rate and Rhythm: Normal rate and regular rhythm.     Heart sounds: Normal heart sounds.  Pulmonary:     Effort: Pulmonary effort is normal.     Breath sounds: Normal breath sounds.  Abdominal:     Palpations: Abdomen is soft.     Tenderness: There is no abdominal tenderness.  Neurological:     Mental Status: Mental status is at baseline.     Cranial Nerves: Dysarthria present.     Labs on Admission: I have personally reviewed following labs and imaging studies  CBC: Recent Labs  Lab 04/25/24 2354  WBC 8.3  NEUTROABS 5.1  HGB 16.7  HCT 47.7  MCV 92.3  PLT 176   Basic Metabolic Panel: Recent Labs  Lab 04/25/24 2354  NA 141  K 3.3*  CL 106  CO2 23  GLUCOSE 151*  BUN 27*  CREATININE 1.01  CALCIUM  9.1   GFR: Estimated  Creatinine Clearance: 38.9 mL/min (by C-G formula based on SCr of 1.01 mg/dL). Liver Function Tests: Recent Labs  Lab 04/25/24 2354  AST 24  ALT 19  ALKPHOS 112  BILITOT 1.6*  PROT 6.8  ALBUMIN 4.3   No results for input(s): LIPASE, AMYLASE in the last 168 hours. No results for input(s): AMMONIA in the last 168 hours. Coagulation Profile: Recent Labs  Lab 04/25/24 2354  INR 1.1   Cardiac Enzymes: No results for input(s): CKTOTAL, CKMB, CKMBINDEX, TROPONINI in the last 168 hours. BNP (last 3 results) No results for input(s): PROBNP in the last 8760 hours. HbA1C: No results for input(s): HGBA1C in the last 72 hours. CBG: Recent Labs  Lab 04/25/24 2341  GLUCAP 117*   Lipid Profile: No results for input(s): CHOL, HDL, LDLCALC, TRIG,  CHOLHDL, LDLDIRECT in the last 72 hours. Thyroid  Function Tests: No results for input(s): TSH, T4TOTAL, FREET4, T3FREE, THYROIDAB in the last 72 hours. Anemia Panel: No results for input(s): VITAMINB12, FOLATE, FERRITIN, TIBC, IRON, RETICCTPCT in the last 72 hours. Urine analysis:    Component Value Date/Time   COLORURINE YELLOW 03/27/2008 1709   APPEARANCEUR CLOUDY (A) 03/27/2008 1709   LABSPEC 1.011 03/27/2008 1709   PHURINE 7.0 03/27/2008 1709   GLUCOSEU NEGATIVE 03/27/2008 1709   HGBUR NEGATIVE 03/27/2008 1709   BILIRUBINUR NEGATIVE 03/27/2008 1709   KETONESUR NEGATIVE 03/27/2008 1709   PROTEINUR NEGATIVE 03/27/2008 1709   UROBILINOGEN 0.2 03/27/2008 1709   NITRITE NEGATIVE 03/27/2008 1709   LEUKOCYTESUR  03/27/2008 1709    NEGATIVE MICROSCOPIC NOT DONE ON URINES WITH NEGATIVE PROTEIN, BLOOD, LEUKOCYTES, NITRITE, OR GLUCOSE <1000 mg/dL.    Radiological Exams on Admission: CT ANGIO HEAD NECK W WO CM W PERF (CODE STROKE) Result Date: 04/26/2024 CLINICAL DATA:  Initial evaluation for acute neuro deficit, stroke. EXAM: CT ANGIOGRAPHY HEAD AND NECK CT PERFUSION BRAIN TECHNIQUE:  Multidetector CT imaging of the head and neck was performed using the standard protocol during bolus administration of intravenous contrast. Multiplanar CT image reconstructions and MIPs were obtained to evaluate the vascular anatomy. Carotid stenosis measurements (when applicable) are obtained utilizing NASCET criteria, using the distal internal carotid diameter as the denominator. Multiphase CT imaging of the brain was performed following IV bolus contrast injection. Subsequent parametric perfusion maps were calculated using RAPID software. RADIATION DOSE REDUCTION: This exam was performed according to the departmental dose-optimization program which includes automated exposure control, adjustment of the mA and/or kV according to patient size and/or use of iterative reconstruction technique. CONTRAST:  OMNIPAQUE  IOHEXOL  350 MG/ML SOLN COMPARISON:  CT from earlier the same day. FINDINGS: CTA NECK FINDINGS Aortic arch: Visualized arch within normal limits for caliber standard branch pattern. Aortic atherosclerosis. No significant stenosis about the origin the great vessels. Right carotid system: No evidence of dissection, stenosis (50% or greater) or occlusion. Left carotid system: No evidence of dissection, stenosis (50% or greater) or occlusion. Vertebral arteries: Both vertebral arteries arise from subclavian arteries. Vertebral arteries are patent without significant stenosis or dissection. Skeleton: No worrisome osseous lesions. Other neck: .  No other acute finding. Upper chest: Sequelae of prior esophagectomy with gastric pull-through partially visualized. No other acute finding. Review of the MIP images confirms the above findings CTA HEAD FINDINGS Anterior circulation: Moderate atheromatous change about the carotid siphons bilaterally. Resultant up to moderate stenosis on the right with no more than mild narrowing on the left. A1 segments, anterior communicating complex common anterior cerebral  arteries patent without stenosis. Normal in stenosis or occlusion. No proximal supra occlusion or high-grade stenosis. Distal MCA branches perfused and symmetric. Posterior circulation: Both V4 segments patent without significant stenosis. Left PICA patent at its origin. Right PICA origin not well seen. Basilar patent without stenosis. Superior cerebellar and posterior cerebral arteries patent bilaterally. Venous sinuses: Patent allowing for timing the contrast bolus. Anatomic variants: None significant.  No aneurysm. Review of the MIP images confirms the above findings CT Brain Perfusion Findings: ASPECTS: 10 CBF (<30%) Volume: 0mL Perfusion (Tmax>6.0s) volume: 0mL Mismatch Volume: 0mL Infarction Location:Negative CT perfusion with no evidence for acute ischemia or other perfusion abnormality. IMPRESSION: 1. Negative CTA for large vessel occlusion or other emergent finding. 2. Negative CT perfusion with no evidence for acute ischemia or other perfusion abnormality. 3. Moderate atheromatous change about the carotid siphons with  resultant moderate stenosis on the right. 4. Sequelae of prior esophagectomy with gastric pull-through, partially visualized. Aortic Atherosclerosis (ICD10-I70.0). Electronically Signed   By: Morene Hoard M.D.   On: 04/26/2024 00:38   CT HEAD CODE STROKE WO CONTRAST Result Date: 04/26/2024 CLINICAL DATA:  Code stroke. Initial evaluation for acute neuro deficit, stroke, aphasia, slurred speech. EXAM: CT HEAD WITHOUT CONTRAST TECHNIQUE: Contiguous axial images were obtained from the base of the skull through the vertex without intravenous contrast. RADIATION DOSE REDUCTION: This exam was performed according to the departmental dose-optimization program which includes automated exposure control, adjustment of the mA and/or kV according to patient size and/or use of iterative reconstruction technique. COMPARISON:  None Available. FINDINGS: Brain: Cerebral volume within normal limits.  Changes of mild chronic microvascular ischemic disease noted. No acute intracranial hemorrhage. No acute large vessel territory infarct. Calcific density adjacent to the superior sagittal sinus near the skull vertex noted, favored to reflect a dural calcification. No mass lesion, midline shift or mass effect no hydrocephalus or extra-axial fluid collection. Vascular: No abnormal hyperdense vessel. Calcified atherosclerosis present at the skull base. Skull: Scalp soft tissues within normal limits.  Calvarium intact. Sinuses/Orbits: Globes and orbital soft tissues within normal limits. Paranasal sinuses are largely clear. No significant mastoid effusion. Other: None ASPECTS (Alberta Stroke Program Early CT Score) - Ganglionic level infarction (caudate, lentiform nuclei, internal capsule, insula, M1-M3 cortex): 7 - Supraganglionic infarction (M4-M6 cortex): 3 Total score (0-10 with 10 being normal): 10 IMPRESSION: 1. No acute intracranial abnormality. 2. Aspects is 10. 3. Mild chronic microvascular ischemic disease. Results were called by telephone at the time of interpretation on 04/26/2024 at 12:00 am to provider PHILLIP STAFFORD , who verbally acknowledged these results. Electronically Signed   By: Morene Hoard M.D.   On: 04/26/2024 00:00   Data Reviewed for HPI: Relevant notes from primary care and specialist visits, past discharge summaries as available in EHR, including Care Everywhere. Prior diagnostic testing as pertinent to current admission diagnoses Updated medications and problem lists for reconciliation ED course, including vitals, labs, imaging, treatment and response to treatment Triage notes, nursing and pharmacy notes and ED provider's notes Notable results as noted above in HPI      Assessment and Plan: * Acute CVA (cerebrovascular accident) [facility]) Presented as a code stroke, seen by teleneurology in the ED Loaded with Plavix  in the ED Permissive hypertension for first 24-48  hrs post stroke onset: Prn Labetalol IV or Vasotec IV If BP greater than 220/120  Statins for LDL goal less than 70 ASA 81mg  daily, Plavix  75mg  daily x 3 weeks then monotherapy thereafter Telemetry, echo with bubble Follow-up MRI Avoid dextrose containing fluids, Maintain euglycemia, euthermia Neuro checks q4 hrs x 24 hrs and then per shift Head of bed 30 degrees Physical therapy/Occupational therapy/Speech therapy if failed dysphagia screen Consider neurology consult in the morning   Underweight (BMI < 18.5) Suspect protein calorie malnutrition BMI 15.89 Can consider dietary consult  Second degree atrioventricular block, Mobitz (type) I History of palpitations Evaluated by cardiology December 2025 S/p Holter 01/2024 revealed predominant sinus bradycardia, occasional PVCs and PACs, and occasional dropped beats due to type I second-degree AV block  Continuous cardiac monitoring  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 Observation status is judged to be reasonable and necessary in order to provide the required intensity of service to ensure the patient's safety. The patient's presenting symptoms, physical exam findings, and initial radiographic and laboratory data in the context of their medical condition is felt to place them at decreased risk for further clinical deterioration. Furthermore, it is anticipated that the patient will be medically stable for discharge from the hospital within 2 midnights of admission.   Author: Delayne LULLA Solian,  MD 04/26/2024 2:30 AM  For on call review [email or web address].      [1]  Allergies Allergen Reactions   Codeine Phosphate [Codeine] Other (See Comments) and Hives    Dizziness   "

## 2024-04-26 NOTE — Consult Note (Signed)
 " [facility] CLINIC CARDIOLOGY CONSULT NOTE       Patient ID: [name] MRN: [id number] DOB/AGE: 28-Jan-1941 84 y.o.  Admit date: 04/25/2024 Referring Physician Dr. Elvan Sor Primary Physician Bertrum Charlie CROME, MD  Primary Cardiologist Dr. Ammon Reason for Consultation Abnormal EKG, stroke  HPI: [name] is a 84 y.o. 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 Cardiology was consulted for further evaluation.   Patient brought in for evaluation of aphasia and slurred speech. Workup in the ED notable for creatinine 1.01, potassium 3.3, hemoglobin 16.7, WBC 8.3. EKG in the ED NSR rate 85 bpm, PVCs and baseline artifact. MRI brain with multiple small cortical infarcts involving both hemispheres - likely embolic.   At the time of my evaluation this morning, he is resting comfortably in hospital bed.  Wife at bedside.  We discussed his presentation in further detail.  He states that last night while watching TV with his wife around 9 or 10:00 they noticed he was having issues with slurred speech.  He denies any prior episodes similar to this.  States that overall he feels this is gradually improving.  States he has a history of palpitations when he has a fever and had an episode of this recently after getting the COVID and flu vaccines.  Other than that he denies any episodes of palpitations.  Also denies any chest pain or shortness of breath.  States that he has had some mild dizziness and lightheadedness since admission.  States that he is very active and walks 2 to 3 miles 5 days a week, denies any recent functional decline.  Review of systems complete and found to be negative unless listed above    Past Medical History:  Diagnosis Date   Arthritis    wrist and ankle   Cancer (HCC)    H/O type B viral hepatitis 1960   History  of esophageal cancer    History of kidney stones    Hypertension    TMJ (temporomandibular joint syndrome)    has had a issure with this lately   Wears hearing aid in both ears    Has, does not wear often    Past Surgical History:  Procedure Laterality Date   APPENDECTOMY     During college   CATARACT EXTRACTION W/PHACO Right 01/19/2022   Procedure: CATARACT EXTRACTION PHACO AND INTRAOCULAR LENS PLACEMENT (IOC) RIGHT  14.57 01:17.8;  Surgeon: Jaye Fallow, MD;  Location: [facility];  Service: Ophthalmology;  Laterality: Right;   CATARACT EXTRACTION W/PHACO Left 02/02/2022   Procedure: CATARACT EXTRACTION PHACO AND INTRAOCULAR LENS PLACEMENT (IOC) LEFT DIABETIC 9.24 01:07.7;  Surgeon: Jaye Fallow, MD;  Location: [facility];  Service: Ophthalmology;  Laterality: Left;   CHOLECYSTECTOMY  1970   COLONOSCOPY WITH PROPOFOL  N/A 09/22/2017   Procedure: COLONOSCOPY WITH PROPOFOL ;  Surgeon: Jinny Carmine, MD;  Location: [facility];  Service: Endoscopy;  Laterality: N/A;   ESOPHAGOGASTRODUODENOSCOPY (EGD) WITH PROPOFOL  N/A 09/22/2017   Procedure: ESOPHAGOGASTRODUODENOSCOPY (EGD) WITH PROPOFOL ;  Surgeon: Jinny Carmine, MD;  Location: [facility];  Service: Endoscopy;  Laterality: N/A;   HERNIA REPAIR Left 1970   POLYPECTOMY  09/22/2017   Procedure: POLYPECTOMY;  Surgeon: Jinny Carmine, MD;  Location: [facility];  Service: Endoscopy;;   Resection of esophageal cancer  2003   TONSILLECTOMY AND ADENOIDECTOMY  as a child   VASECTOMY      Medications Prior to Admission  Medication Sig Dispense Refill Last Dose/Taking   acidophilus (RISAQUAD) CAPS capsule Take 1 capsule by mouth daily.   Unknown   amLODipine -benazepril  (LOTREL) 5-20 MG capsule TAKE 1 CAPSULE BY MOUTH DAILY 30 capsule 0 Unknown   metoprolol  succinate (TOPROL -XL) 50 MG 24 hr tablet TAKE 1 TABLET EVERY DAY, TAKE WITH OR IMMEDIATELY FOLLOWING A MEAL. NEED TO SCHEDULE FOLLOW UP WITH DR  GILBERT 90 tablet 0 Unknown   Multiple Vitamin (MULTIVITAMIN WITH MINERALS) TABS tablet Take 1 tablet by mouth daily.   Unknown   OVER THE COUNTER MEDICATION daily. Morning kick (powder) by [facility]      Social History   Socioeconomic History   Marital status: Married    Spouse name: Not on file   Number of children: Not on file   Years of education: Not on file   Highest education level: Not on file  Occupational History   Not on file  Tobacco Use   Smoking status: Former    Current packs/day: 0.00    Average packs/day: 2.0 packs/day for 10.0 years (20.0 ttl pk-yrs)    Types: Cigarettes    Start date: 03/28/1962    Quit date: 03/28/1972    Years since quitting: 52.1   Smokeless tobacco: Never  Vaping Use   Vaping status: Never Used  Substance and Sexual Activity   Alcohol use: No   Drug use: No   Sexual activity: Not on file  Other Topics Concern   Not on file  Social History Narrative   Not on file   Social Drivers of Health   Tobacco Use: Medium Risk (02/27/2024)   Received from [facility]   Patient History    Smoking Tobacco Use: Former    Smokeless Tobacco Use: Never    Passive Exposure: Not on file  Financial Resource Strain: Low Risk  (01/31/2024)   Received from [facility]   Overall Financial Resource Strain (CARDIA)    Difficulty of Paying Living Expenses: Not hard at all  Food Insecurity: No Food Insecurity (01/31/2024)   Received from [facility]   Epic    Within the past 12 months, you worried that your food would run out before you got the money to buy more.: Never true    Within the past 12 months, the food you bought just didn't last and you didn't have money to get more.: Never true  Transportation Needs: No Transportation Needs (01/31/2024)   Received from [facility] - Transportation    In the past 12 months, has lack of transportation kept you from medical  appointments or from getting medications?: No    Lack of Transportation (Non-Medical): No  Physical Activity: Sufficiently Active (12/17/2022)   Received from [facility]   Exercise Vital Sign    On average, how many days per week do you engage in moderate to strenuous exercise (like a brisk walk)?: 5 days    On average, how many minutes do you engage in exercise at this level?: 60 min  Stress: Not on file  Social Connections: Not on file  Intimate Partner Violence: Not on file  Depression (PHQ2-9): Low Risk (07/07/2021)   Depression (PHQ2-9)    PHQ-2 Score: 1  Alcohol Screen: Low Risk (07/07/2021)   Alcohol Screen    Last Alcohol Screening Score (AUDIT): 0  Housing: Unknown (01/31/2024)  Received from [facility]   Epic    In the last 12 months, was there a time when you were not able to pay the mortgage or rent on time?: No    Number of Times Moved in the Last Year: Not on file    At any time in the past 12 months, were you homeless or living in a shelter (including now)?: No  Utilities: Not At Risk (01/31/2024)   Received from [facility]   Epic    In the past 12 months has the electric, gas, oil, or water  company threatened to shut off services in your home?: No  Health Literacy: Adequate Health Literacy (12/17/2022)   Received from [facility]   B1300 Health Literacy    Frequency of need for help with medical instructions: Never    Family History  Problem Relation Age of Onset   Cancer Sister        Breast cancer     Vitals:   04/26/24 0200 04/26/24 0354 04/26/24 0837 04/26/24 1211  BP: (!) 146/88 (!) 163/85 (!) 150/90 125/79  Pulse: 72 76 81 77  Resp: 20 20 17 16   Temp:  98 F (36.7 C) 98.8 F (37.1 C) 97.6 F (36.4 C)  TempSrc:      SpO2: 96% 99% 99% 99%  Weight:      Height:        PHYSICAL EXAM General: Well-appearing elderly male, well nourished, in no acute distress. HEENT:  Normocephalic and atraumatic. Neck: No JVD.  Lungs: Normal respiratory effort on room air. Clear bilaterally to auscultation. No wheezes, crackles, rhonchi.  Heart: HRRR. Normal S1 and S2 without gallops or murmurs.  Abdomen: Non-distended appearing.  Msk: Normal strength and tone for age. Extremities: Warm and well perfused. No clubbing, cyanosis.  No edema.  Neuro: Alert and oriented X 3. Psych: Answers questions appropriately.   Labs: Basic Metabolic Panel: Recent Labs    04/25/24 2354  NA 141  K 3.3*  CL 106  CO2 23  GLUCOSE 151*  BUN 27*  CREATININE 1.01  CALCIUM  9.1   Liver Function Tests: Recent Labs    04/25/24 2354  AST 24  ALT 19  ALKPHOS 112  BILITOT 1.6*  PROT 6.8  ALBUMIN 4.3   No results for input(s): LIPASE, AMYLASE in the last 72 hours. CBC: Recent Labs    04/25/24 2354  WBC 8.3  NEUTROABS 5.1  HGB 16.7  HCT 47.7  MCV 92.3  PLT 176   Cardiac Enzymes: No results for input(s): CKTOTAL, CKMB, CKMBINDEX, TROPONINIHS in the last 72 hours. BNP: No results for input(s): BNP in the last 72 hours. D-Dimer: No results for input(s): DDIMER in the last 72 hours. Hemoglobin A1C: Recent Labs    04/26/24 0635  HGBA1C 5.7*   Fasting Lipid Panel: Recent Labs    04/26/24 0635  CHOL 130  HDL 48  LDLCALC 71  TRIG 52  CHOLHDL 2.7   Thyroid  Function Tests: Recent Labs    04/26/24 0635  TSH 2.590   Anemia Panel: No results for input(s): VITAMINB12, FOLATE, FERRITIN, TIBC, IRON, RETICCTPCT in the last 72 hours.   Radiology: ECHOCARDIOGRAM COMPLETE BUBBLE STUDY Result Date: 04/26/2024    ECHOCARDIOGRAM REPORT   Patient Name:   [name] Date of Exam: 04/26/2024 Medical Rec #:  [id number]       Height:       69.0 in Accession #:    [id number]  Weight:       107.6 lb Date of Birth:  03/24/1941      BSA:          1.587 m Patient Age:    83 years        BP:           150/90 mmHg Patient Gender: M               HR:            81 bpm. Exam Location:  [facility] Procedure: 2D Echo, Cardiac Doppler, Color Doppler and Saline Contrast Bubble            Study (Both Spectral and Color Flow Doppler were utilized during            procedure). Indications:     Stroke 434.91 / I63.9  History:         Patient has no prior history of Echocardiogram examinations.                  Risk Factors:Hypertension.  Sonographer:     Christopher Furnace Referring Phys:  [id number] DELAYNE LULLA SOLIAN Diagnosing Phys: Keller Paterson  Sonographer Comments: Technically difficult study due to poor echo windows. Image acquisition challenging due to patient body habitus and Pt has pectus excavatum. IMPRESSIONS  1. Technically very difficult and limited study.  2. Left ventricle not well visualized. Preserved systolic function in very limited views.  3. Right ventricular systolic function was not well visualized. The right ventricular size is not well visualized.  4. The mitral valve was not well visualized.  5. The aortic valve was not well visualized. FINDINGS  Left Ventricle: Left ventricle not well visualized. Preserved systolic function in very limited views. Right Ventricle: The right ventricular size is not well visualized. Right vetricular wall thickness was not well visualized. Right ventricular systolic function was not well visualized. Left Atrium: Left atrial size was not well visualized. Right Atrium: Right atrial size was not well visualized. Pericardium: There is no evidence of pericardial effusion. Mitral Valve: The mitral valve was not well visualized. Tricuspid Valve: The tricuspid valve is not well visualized. Aortic Valve: The aortic valve was not well visualized. Pulmonic Valve: The pulmonic valve was not well visualized. IAS/Shunts: The interatrial septum was not well visualized. Agitated saline contrast was given intravenously to evaluate for intracardiac shunting.  LEFT VENTRICLE PLAX 2D LVIDd:         2.36 cm LVIDs:         2.01 cm LV PW:         1.26 cm LV  IVS:        1.79 cm LVOT diam:     2.10 cm LVOT Area:     3.46 cm  LEFT ATRIUM         Index LA diam:    2.80 cm 1.76 cm/m  TRICUSPID VALVE TR Peak grad:   18.7 mmHg TR Vmax:        216.00 cm/s  SHUNTS Systemic Diam: 2.10 cm Keller Paterson Electronically signed by Keller Paterson Signature Date/Time: 04/26/2024/12:12:30 PM    Final    MR BRAIN WO CONTRAST Result Date: 04/26/2024 EXAM: MRI BRAIN WITHOUT CONTRAST 04/26/2024 06:12:51 AM TECHNIQUE: Multiplanar multisequence MRI of the head/brain was performed without the administration of intravenous contrast. COMPARISON: None available. CLINICAL HISTORY: Neuro deficit, acute, stroke suspected. FINDINGS: BRAIN AND VENTRICLES: There are numerous small cortical infarcts involving the cerebral hemispheres bilaterally, most pronounced at the  right parietooccipital junction. 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 There is moderate periventricular and subcortical cerebral white matter disease present. There is a cyst within the right choroidal fissure. No intracranial hemorrhage. No mass. No midline shift. No hydrocephalus. The sella is unremarkable. Normal flow voids. ORBITS: The patient is status post bilateral lens replacement. SINUSES AND MASTOIDS: There is mild mucosal disease in the floor of the left maxillary sinus. BONES AND SOFT TISSUES: Normal marrow signal. No soft tissue abnormality. IMPRESSION: 1. 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 2. Moderate periventricular and subcortical cerebral white matter disease. Electronically signed by: Evalene Coho MD 04/26/2024 07:35 AM EST RP Workstation: [id number]   CT ANGIO HEAD NECK W WO CM W PERF (CODE STROKE) Result Date: 04/26/2024 CLINICAL DATA:  Initial  evaluation for acute neuro deficit, stroke. EXAM: CT ANGIOGRAPHY HEAD AND NECK CT PERFUSION BRAIN TECHNIQUE: Multidetector CT imaging of the head and neck was performed using the standard protocol during bolus administration of intravenous contrast. Multiplanar CT image reconstructions and MIPs were obtained to evaluate the vascular anatomy. Carotid stenosis measurements (when applicable) are obtained utilizing NASCET criteria, using the distal internal carotid diameter as the denominator. Multiphase CT imaging of the brain was performed following IV bolus contrast injection. Subsequent parametric perfusion maps were calculated using RAPID software. RADIATION DOSE REDUCTION: This exam was performed according to the departmental dose-optimization program which includes automated exposure control, adjustment of the mA and/or kV according to patient size and/or use of iterative reconstruction technique. CONTRAST:  OMNIPAQUE  IOHEXOL  350 MG/ML SOLN COMPARISON:  CT from earlier the same day. FINDINGS: CTA NECK FINDINGS Aortic arch: Visualized arch within normal limits for caliber standard branch pattern. Aortic atherosclerosis. No significant stenosis about the origin the great vessels. Right carotid system: No evidence of dissection, stenosis (50% or greater) or occlusion. Left carotid system: No evidence of dissection, stenosis (50% or greater) or occlusion. Vertebral arteries: Both vertebral arteries arise from subclavian arteries. Vertebral arteries are patent without significant stenosis or dissection. Skeleton: No worrisome osseous lesions. Other neck: .  No other acute finding. Upper chest: Sequelae of prior esophagectomy with gastric pull-through partially visualized. No other acute finding. Review of the MIP images confirms the above findings CTA HEAD FINDINGS Anterior circulation: Moderate atheromatous change about the carotid siphons bilaterally. Resultant up to moderate stenosis on the right with no more  than mild narrowing on the left. A1 segments, anterior communicating complex common anterior cerebral arteries patent without stenosis. Normal in stenosis or occlusion. No proximal supra occlusion or high-grade stenosis. Distal MCA branches perfused and symmetric. Posterior circulation: Both V4 segments patent without significant stenosis. Left PICA patent at its origin. Right PICA origin not well seen. Basilar patent without stenosis. Superior cerebellar and posterior cerebral arteries patent bilaterally. Venous sinuses: Patent allowing for timing the contrast bolus. Anatomic variants: None significant.  No aneurysm. Review of the MIP images confirms the above findings CT Brain Perfusion Findings: ASPECTS: 10 CBF (<30%) Volume: 0mL Perfusion (Tmax>6.0s) volume: 0mL Mismatch Volume: 0mL Infarction Location:Negative CT perfusion with no evidence for acute ischemia or other perfusion abnormality. IMPRESSION: 1. Negative CTA for large vessel occlusion or other emergent finding. 2. Negative CT perfusion with no evidence for acute ischemia or other perfusion abnormality. 3. Moderate atheromatous change about the carotid siphons with resultant moderate stenosis on the  right. 4. Sequelae of prior esophagectomy with gastric pull-through, partially visualized. Aortic Atherosclerosis (ICD10-I70.0). Electronically Signed   By: Morene Hoard M.D.   On: 04/26/2024 00:38   CT HEAD CODE STROKE WO CONTRAST Result Date: 04/26/2024 CLINICAL DATA:  Code stroke. Initial evaluation for acute neuro deficit, stroke, aphasia, slurred speech. EXAM: CT HEAD WITHOUT CONTRAST TECHNIQUE: Contiguous axial images were obtained from the base of the skull through the vertex without intravenous contrast. RADIATION DOSE REDUCTION: This exam was performed according to the departmental dose-optimization program which includes automated exposure control, adjustment of the mA and/or kV according to patient size and/or use of iterative  reconstruction technique. COMPARISON:  None Available. FINDINGS: Brain: Cerebral volume within normal limits. Changes of mild chronic microvascular ischemic disease noted. No acute intracranial hemorrhage. No acute large vessel territory infarct. Calcific density adjacent to the superior sagittal sinus near the skull vertex noted, favored to reflect a dural calcification. No mass lesion, midline shift or mass effect no hydrocephalus or extra-axial fluid collection. Vascular: No abnormal hyperdense vessel. Calcified atherosclerosis present at the skull base. Skull: Scalp soft tissues within normal limits.  Calvarium intact. Sinuses/Orbits: Globes and orbital soft tissues within normal limits. Paranasal sinuses are largely clear. No significant mastoid effusion. Other: None ASPECTS (Alberta Stroke Program Early CT Score) - Ganglionic level infarction (caudate, lentiform nuclei, internal capsule, insula, M1-M3 cortex): 7 - Supraganglionic infarction (M4-M6 cortex): 3 Total score (0-10 with 10 being normal): 10 IMPRESSION: 1. No acute intracranial abnormality. 2. Aspects is 10. 3. Mild chronic microvascular ischemic disease. Results were called by telephone at the time of interpretation on 04/26/2024 at 12:00 am to provider PHILLIP STAFFORD , who verbally acknowledged these results. Electronically Signed   By: Morene Hoard M.D.   On: 04/26/2024 00:00    ECHO as above  TELEMETRY (personally reviewed): Sinus rhythm rate 60s  EKG (personally reviewed): 85 bpm, PVCs and baseline artifact  Data reviewed by me 04/26/2024: last 24h vitals tele labs imaging I/O ED provider note, admission H&P  Principal Problem:   Acute CVA (cerebrovascular accident) [facility]) Active Problems:   Hypertension   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)   History of esophageal dilatation 2012   Second degree atrioventricular block, Mobitz (type) I   History of palpitations   Underweight (BMI <  18.5)    ASSESSMENT AND PLAN:  [name] is a 84 y.o. 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 Cardiology was consulted for further evaluation.   # Acute CVA # Hypertension Patient presented with complaints of slurred speech, found to have multiple CVA on MRI brain. Echo this admission limited, but EF does appear preserved.  -Continue atorvastatin  40 mg daily, aspirin  81 mg daily, and plavix  75 mg daily.  -No indication for DOAC at this time as there is no evidence of AF on telemetry. Would need 30 day monitor on discharge. If no evidence of AF on 30 day monitor, would recommend implanted loop recorder.  -Will plan for TEE for additional evaluation of heart function, possible causes of stroke. Of note, patient does have hx of esophagogastrectomy in 2003. Had upper endoscopy 2019 with no obstruction/stricture.    This patient's plan of care was discussed and created with Dr. Wilburn and he is in agreement.  Signed: Danita Bloch, PA-C  04/26/2024, 12:31 PM [facility]  Cardiology      "

## 2024-04-26 NOTE — Assessment & Plan Note (Addendum)
 Presented as a code stroke, seen by teleneurology in the ED Loaded with Plavix  in the ED Permissive hypertension for first 24-48 hrs post stroke onset: Prn Labetalol IV or Vasotec IV If BP greater than 220/120  Statins for LDL goal less than 70 ASA 81mg  daily, Plavix  75mg  daily x 3 weeks then monotherapy thereafter Telemetry, echo with bubble Follow-up MRI Avoid dextrose containing fluids, Maintain euglycemia, euthermia Neuro checks q4 hrs x 24 hrs and then per shift Head of bed 30 degrees Physical therapy/Occupational therapy/Speech therapy if failed dysphagia screen Consider neurology consult in the morning

## 2024-04-26 NOTE — Evaluation (Signed)
 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) 04/26/2024   History of esophageal dilatation 2012 04/26/2024   Second degree atrioventricular block, Mobitz (type) I 04/26/2024   History of palpitations 04/26/2024   Underweight (BMI < 18.5) 04/26/2024   Protein-calorie malnutrition, severe 04/26/2024   Epigastric pain    Colon cancer screening    Benign neoplasm of ascending colon    H/O type B viral hepatitis 05/01/2015   Hernia, inguinal, right 12/30/2013   Malignant neoplasm of gastrointestinal tract (HCC) 11/09/2011   Hypertension 08/03/2007   Past Medical History:  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    Has, does not wear often   Past Surgical History:  Past Surgical History:  Procedure Laterality Date   APPENDECTOMY     During college   CATARACT EXTRACTION W/PHACO Right 01/19/2022   Procedure: CATARACT EXTRACTION PHACO AND INTRAOCULAR LENS PLACEMENT (IOC) RIGHT  14.57 01:17.8;  Surgeon: Jaye Fallow, MD;  Location: [facility];  Service: Ophthalmology;  Laterality: Right;   CATARACT EXTRACTION W/PHACO Left 02/02/2022   Procedure: CATARACT EXTRACTION PHACO AND INTRAOCULAR LENS PLACEMENT (IOC) LEFT DIABETIC 9.24 01:07.7;  Surgeon: Jaye Fallow, MD;  Location: [facility];  Service: Ophthalmology;  Laterality: Left;   CHOLECYSTECTOMY   1970   COLONOSCOPY WITH PROPOFOL  N/A 09/22/2017   Procedure: COLONOSCOPY WITH PROPOFOL ;  Surgeon: Jinny Carmine, MD;  Location: [facility];  Service: Endoscopy;  Laterality: N/A;   ESOPHAGOGASTRODUODENOSCOPY (EGD) WITH PROPOFOL  N/A 09/22/2017   Procedure: ESOPHAGOGASTRODUODENOSCOPY (EGD) WITH PROPOFOL ;  Surgeon: Jinny Carmine, MD;  Location: [facility];  Service: Endoscopy;  Laterality: N/A;   HERNIA REPAIR Left 1970   POLYPECTOMY  09/22/2017   Procedure: POLYPECTOMY;  Surgeon: Jinny Carmine, MD;  Location: [facility];  Service: Endoscopy;;   Resection of esophageal cancer  2003   TONSILLECTOMY AND ADENOIDECTOMY     as a child   VASECTOMY     HPI:  [name] is a 84 y.o. male with medical history significant for GIST s/p transhiatal esophagogastrectomy (2003), esophageal stricture s/p dilatation, HTN, nephrolithiasis, recently evaluated by cardiology for palpitations-Holter showing predominant sinus bradycardia/type I second-degree AV block/occasional PVCs and PACs, being admitted for stroke workup after presenting as a code stroke.  Patient had onset of aphasia and slurred speech starting an hour prior to arrival associated with dizziness and generalized weakness.  Code stroke was activated upon arrival.  CT head was nonacute And CT head and neck negative for LVO or other emergent finding.  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  He was seen in consultation by teleneurologist with an NIHSS of 2 for subtle dysarthria and as such thrombolytics were not indicated due to nondisabling deficits. 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  2. Moderate periventricular and subcortical cerebral white matter disease.   Assessment / Plan / Recommendation Clinical Impression  Pt  seen for cognitive linguistic evaluation in the setting of acute onset of slurred speech. Assessment consisting of pt and spouse interview and completion of dynamic assessment. Independent orientation, expressive/receptive language, attention, functional recall, and visual processing demonstrated during assessment. Motor speech notable slightly reduced articulation precision, though grossly intelligible at conversational level. Pt with independent awareness for current deficits, reporting improved speech quality compared to symptom onset. Education shared regarding use of over-articulation and increased volume to bolster speech and extraneous factors that can contribute to speech changes (frustration, fatigue, etc). No further acute SLP services warranted at this time. Pt with potential to benefit from OP SLP services if desired by pt. Pt and spouse reporting understanding across recommendations and education.     SLP Assessment  SLP Recommendation/Assessment: All further Speech Language Pathology needs can be addressed in the next venue of care SLP Visit Diagnosis: Dysarthria and anarthria (R47.1)     Assistance Recommended at Discharge  None  Functional Status Assessment  (minimal change from baseline)        SLP Evaluation Cognition  Overall Cognitive Status: Within Functional Limits for tasks assessed Arousal/Alertness: Awake/alert Orientation Level: Oriented X4 Attention: Focused;Sustained Focused Attention: Appears intact Sustained Attention: Appears intact Memory: Appears intact Awareness: Appears intact Problem Solving: Appears intact Safety/Judgment: Appears intact       Comprehension  Auditory Comprehension Overall Auditory Comprehension: Appears within functional limits for tasks assessed Visual Recognition/Discrimination Discrimination: Within Function Limits Reading Comprehension Reading Status: Not tested    Expression Expression Primary Mode of Expression:  Verbal Verbal Expression Overall Verbal Expression: Appears within functional limits for tasks assessed Written Expression Written Expression: Not tested   Oral / Motor  Oral Motor/Sensory Function Overall Oral Motor/Sensory Function: Within functional limits Motor Speech Overall Motor Speech: Impaired Respiration: Within functional limits Phonation: Normal Resonance: Within functional limits Articulation: Impaired Level of Impairment: Conversation Intelligibility: Intelligible Motor Planning: Within functional limits Motor Speech Errors: Aware Effective Techniques: Slow rate;Over-articulate           Eriyanna Kofoed Clapp, MS, CCC-SLP Speech Language Pathologist Rehab Services; [facility] [phone] (ascom)   Tristyn Demarest J Clapp 04/26/2024, 1:10 PM

## 2024-04-26 NOTE — Assessment & Plan Note (Signed)
 History of esophageal stricture s/p dilatation 2012 No acute issues suspected

## 2024-04-26 NOTE — Progress Notes (Signed)
 SPIRITUAL CARE AND COUNSELING CONSULT NOTE   VISIT SUMMARY Chaplain responded to Code stroke page. Chaplain consulted with the staff. Chaplain remains available for follow up spiritual and emotional support.   SPIRITUAL ENCOUNTER                                                                                                                                                                      Type of Visit: Initial Care provided to:: Family Conversation partners present during encounter: Nurse Referral source: Clinical staff Reason for visit: Urgent spiritual support OnCall Visit: Yes   SPIRITUAL FRAMEWORK  Presenting Themes: Significant life change, Caregiving needs Community/Connection: Other (comment) (nursing/assisted living community) Patient Stress Factors: Health changes Family Stress Factors: None identified   GOALS   Self/Personal Goals: healing Clinical Care Goals: healing   INTERVENTIONS   Spiritual Care Interventions Made: Compassionate presence    INTERVENTION OUTCOMES   Outcomes: Awareness of support  SPIRITUAL CARE PLAN   Spiritual Care Issues Still Outstanding: Chaplain will continue to follow    If immediate needs arise, please contact [facility] 24 hour on call [phone]   Barabara Chess, Chaplain  04/26/2024 12:04 AM

## 2024-04-26 NOTE — ED Triage Notes (Signed)
 Pt to ED via POV, ambulatory to triage. Per wife aprox 1 hour pta pt began to have aphasia and slurred speech. Pt presents with slurred speech and generalized weakness. Pt is not on blood thinner, reports hx esophageal cx 20 years ago. Pt also reports feeling dizzy. LNW 2200

## 2024-04-26 NOTE — Plan of Care (Signed)
 No charge note Patient was admitted overnight, H&P reviewed Patient was admitted due to acute stroke.  MRI positive for embolic stroke Possible cardiac source for emboli Neurology is following, started aspirin  and Plavix  and statin for now Cardiology consulted, plan is for TEE tomorrow a.m. to rule out cardiac thromboembolic source Patient will need cardiac loop monitor on discharge Continue current treatment

## 2024-04-26 NOTE — Assessment & Plan Note (Signed)
Holding home antihypertensives to allow for permissive hypertension

## 2024-04-26 NOTE — Evaluation (Signed)
 Physical Therapy Evaluation Patient Details Name: [name] MRN: [id number] DOB: [birth year] Today's Date: 04/26/2024  History of Present Illness  Pt is an 84 y.o. male with medical history significant for GIST s/p transhiatal esophagogastrectomy (2003), esophageal stricture s/p dilatation, HTN, nephrolithiasis, recently evaluated by cardiology for palpitations-Holter showing predominant sinus bradycardia/type I second-degree AV block/occasional PVCs and PACs, being admitted for stroke workup after presenting as a code stroke.  Patient had onset of aphasia and slurred speech starting an hour prior to arrival associated with dizziness and generalized weakness. 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   Clinical Impression  Patient alert, agreeable to PT/OT. Upon assessment pt did not display any LE strength deficits,denied sensation changes. He was able to perform all mobility at a supervision level, DGI score indicated no falls risk. The patient demonstrated and reported return to baseline level of functioning, no further acute PT needs indicated. PT to sign off. Please reconsult PT if pt status changes or acute needs are identified.          If plan is discharge home, recommend the following: Other (comment) (NA)   Can travel by private vehicle        Equipment Recommendations None recommended by PT  Recommendations for Other Services       Functional Status Assessment Patient has not had a recent decline in their functional status     Precautions / Restrictions Precautions Precautions: None Recall of Precautions/Restrictions: Intact Restrictions Weight Bearing Restrictions Per Provider Order: No      Mobility  Bed Mobility Overal bed mobility: Modified Independent                   Transfers Overall transfer level: Needs assistance Equipment used: None Transfers: Sit to/from Stand Sit to Stand: Supervision                Ambulation/Gait Ambulation/Gait assistance: Supervision Gait Distance (Feet): 200 Feet Assistive device: None            Stairs            Wheelchair Mobility     Tilt Bed    Modified Rankin (Stroke Patients Only)       Balance Overall balance assessment: Needs assistance Sitting-balance support: No upper extremity supported, Feet supported Sitting balance-Leahy Scale: Normal     Standing balance support: No upper extremity supported, During functional activity Standing balance-Leahy Scale: Good                   Standardized Balance Assessment Standardized Balance Assessment : Dynamic Gait Index   Dynamic Gait Index Level Surface: Normal Change in Gait Speed: Normal Gait with Horizontal Head Turns: Normal Gait with Vertical Head Turns: Normal Gait and Pivot Turn: Normal Step Over Obstacle: Normal Step Around Obstacles: Normal Steps: Normal (anticipate normal) Total Score: 24       Pertinent Vitals/Pain Pain Assessment Pain Assessment: No/denies pain    Home Living Family/patient expects to be discharged to:: Private residence Living Arrangements: Spouse/significant other   Type of Home: House Home Access: Level entry       Home Layout: One level Home Equipment: Hand held shower head;Shower seat - built in      Prior Function Prior Level of Function : Independent/Modified Independent;Driving  Mobility Comments: walks 2-70miles 6x a week       Extremity/Trunk Assessment   Upper Extremity Assessment Upper Extremity Assessment: Overall WFL for tasks assessed    Lower Extremity Assessment Lower Extremity Assessment: Overall WFL for tasks assessed    Cervical / Trunk Assessment Cervical / Trunk Assessment: Normal  Communication    Communication Communication: No apparent difficulties    Cognition Arousal: Alert Behavior During Therapy: WFL for tasks assessed/performed   PT - Cognitive impairments: No apparent impairments                         Following commands: Intact       Cueing Cueing Techniques: Verbal cues     General Comments      Exercises     Assessment/Plan    PT Assessment Patient does not need any further PT services  PT Problem List         PT Treatment Interventions      PT Goals (Current goals can be found in the Care Plan section)       Frequency       Co-evaluation               AM-PAC PT 6 Clicks Mobility  Outcome Measure Help needed turning from your back to your side while in a flat bed without using bedrails?: None Help needed moving from lying on your back to sitting on the side of a flat bed without using bedrails?: None Help needed moving to and from a bed to a chair (including a wheelchair)?: None Help needed standing up from a chair using your arms (e.g., wheelchair or bedside chair)?: None Help needed to walk in hospital room?: None Help needed climbing 3-5 steps with a railing? : None 6 Click Score: 24    End of Session   Activity Tolerance: Patient tolerated treatment well Patient left: in bed;with call bell/phone within reach;with bed alarm set Nurse Communication: Mobility status PT Visit Diagnosis: Other abnormalities of gait and mobility (R26.89)    Time: 9140-9090 PT Time Calculation (min) (ACUTE ONLY): 10 min   Charges:   PT Evaluation $PT Eval Low Complexity: 1 Low   PT General Charges $$ ACUTE PT VISIT: 1 Visit         Doyal Shams PT, DPT 12:18 PM,04/26/24

## 2024-04-26 NOTE — Consult Note (Signed)
 Requesting Physician: Von    Chief Complaint: Slurred speech  I have been asked by Dr. Von to see this patient in consultation for slurred speech.  HPI: [name] is an 84 y.o. male with a history of HTN and esophageal cancer who presented with complaints of slurred speech.  Patient with with wife watching television and after attempted to speak to wife and noted slurred speech.  Patient was brought in for evaluation at that time  Date last known well: 04/25/2024 Time last known well: 1930 tPA Given: No: Nondisabling symptoms Thrombectomy candidate: No, no target lesion identified mRankin: 0  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    Has, does not wear often    Past Surgical History:  Procedure Laterality Date   APPENDECTOMY     During college   CATARACT EXTRACTION W/PHACO Right 01/19/2022   Procedure: CATARACT EXTRACTION PHACO AND INTRAOCULAR LENS PLACEMENT (IOC) RIGHT  14.57 01:17.8;  Surgeon: Jaye Fallow, MD;  Location: [facility];  Service: Ophthalmology;  Laterality: Right;   CATARACT EXTRACTION W/PHACO Left 02/02/2022   Procedure: CATARACT EXTRACTION PHACO AND INTRAOCULAR LENS PLACEMENT (IOC) LEFT DIABETIC 9.24 01:07.7;  Surgeon: Jaye Fallow, MD;  Location: [facility];  Service: Ophthalmology;  Laterality: Left;   CHOLECYSTECTOMY  1970   COLONOSCOPY WITH PROPOFOL  N/A 09/22/2017   Procedure: COLONOSCOPY WITH PROPOFOL ;  Surgeon: Jinny Carmine, MD;  Location: [facility];  Service: Endoscopy;  Laterality: N/A;   ESOPHAGOGASTRODUODENOSCOPY (EGD) WITH PROPOFOL  N/A 09/22/2017   Procedure: ESOPHAGOGASTRODUODENOSCOPY (EGD) WITH PROPOFOL ;  Surgeon: Jinny Carmine, MD;   Location: [facility];  Service: Endoscopy;  Laterality: N/A;   HERNIA REPAIR Left 1970   POLYPECTOMY  09/22/2017   Procedure: POLYPECTOMY;  Surgeon: Jinny Carmine, MD;  Location: [facility];  Service: Endoscopy;;   Resection of esophageal cancer  2003   TONSILLECTOMY AND ADENOIDECTOMY     as a child   VASECTOMY      Family History  Problem Relation Age of Onset   Cancer Sister        Breast cancer   Social History:  reports that he quit smoking about 52 years ago. His smoking use included cigarettes. He started smoking about 62 years ago. He has a 20 pack-year smoking history. He has never used smokeless tobacco. He reports that he does not drink alcohol and does not use drugs.  Allergies: Allergies[1]  Medications:  Prior to Admission medications  Medication Sig Start Date End Date Taking? Authorizing Provider  acidophilus (RISAQUAD) CAPS capsule Take 1 capsule by mouth daily.   Yes [provider]  amLODipine -benazepril  (LOTREL) 5-20 MG capsule TAKE 1 CAPSULE BY MOUTH DAILY 07/15/22  Yes Gasper Nancyann BRAVO, MD  metoprolol  succinate (TOPROL -XL) 50 MG 24 hr tablet TAKE 1 TABLET EVERY DAY, TAKE WITH OR IMMEDIATELY FOLLOWING A MEAL. NEED TO SCHEDULE FOLLOW UP WITH DR BERTRUM 06/28/22  Yes Gasper Nancyann BRAVO, MD  Multiple Vitamin (MULTIVITAMIN WITH MINERALS) TABS tablet Take 1 tablet by mouth daily.   Yes [provider]  OVER THE COUNTER MEDICATION daily. Morning kick (powder) by [facility]  [provider]     ROS: History obtained from the patient  General ROS: negative for - chills, fatigue, fever, night sweats, weight gain or weight loss Psychological ROS: negative for - behavioral disorder, hallucinations, memory difficulties, mood swings or suicidal ideation Ophthalmic ROS: negative for - blurry vision, double vision, eye pain or loss of vision ENT ROS: negative for - epistaxis, nasal discharge, oral lesions, sore throat, tinnitus or  vertigo Allergy and Immunology ROS: negative for - hives or itchy/watery eyes Hematological and Lymphatic ROS: negative for - bleeding problems, bruising or swollen lymph nodes Endocrine ROS: negative for - galactorrhea, hair pattern changes, polydipsia/polyuria or temperature intolerance Respiratory ROS: negative for - cough, hemoptysis, shortness of breath or wheezing Cardiovascular ROS: negative for - chest pain, dyspnea on exertion, edema or irregular heartbeat Gastrointestinal ROS: negative for - abdominal pain, diarrhea, hematemesis, nausea/vomiting or stool incontinence Genito-Urinary ROS: negative for - dysuria, hematuria, incontinence or urinary frequency/urgency Musculoskeletal ROS: negative for - joint swelling or muscular weakness Neurological ROS: as noted in HPI Dermatological ROS: negative for rash and skin lesion changes   Physical Examination: Blood pressure 125/79, pulse 77, temperature 97.6 F (36.4 C), resp. rate 16, height 5' 9 (1.753 m), weight 48.8 kg, SpO2 99%.  HEENT-  Normocephalic, no lesions, without obvious abnormality.  Normal external eye and conjunctiva.  Normal external ears. Normal external nose, mucus membranes and septum. Cardiovascular- Single S1, S2 Lungs- CTA Abdomen- soft, non-tender; bowel sounds normal; no masses,  no organomegaly Extremities- no edema Musculoskeletal-no joint tenderness, deformity or swelling Skin-warm and dry, no hyperpigmentation, vitiligo, or suspicious lesions  Neurological Examination   Mental Status: Alert, oriented, thought content appropriate.  Speech fluent without evidence of aphasia.  Dysarthric.  Able to follow 3 step commands without difficulty. Cranial Nerves: II: Visual fields grossly normal III,IV, VI: ptosis not present, extra-ocular motions intact bilaterally V,VII: smile symmetric, facial light touch sensation normal bilaterally VIII: hearing normal bilaterally XI: bilateral shoulder shrug XII: midline  tongue extension Motor: Right : Upper extremity   5/5    Left:     Upper extremity   5/5  Lower extremity   5/5     Lower extremity   5/5 Tone and bulk:normal tone throughout; no atrophy noted Sensory: Pinprick and light touch intact throughout, bilaterally Deep Tendon Reflexes: Symmetric throughout Plantars: Right: absent   Left: absent Cerebellar: normal finger-to-nose and normal heel-to-shin testing bilaterally Gait: not tested due to safety concerns    Laboratory Studies:  Basic Metabolic Panel: Recent Labs  Lab 04/25/24 2354  NA 141  K 3.3*  CL 106  CO2 23  GLUCOSE 151*  BUN 27*  CREATININE 1.01  CALCIUM  9.1    Liver Function Tests: Recent Labs  Lab 04/25/24 2354  AST 24  ALT 19  ALKPHOS 112  BILITOT 1.6*  PROT 6.8  ALBUMIN 4.3   No results for input(s): LIPASE, AMYLASE in the last 168 hours. No results for input(s): AMMONIA in the last 168 hours.  CBC: Recent Labs  Lab 04/25/24 2354  WBC 8.3  NEUTROABS 5.1  HGB 16.7  HCT 47.7  MCV 92.3  PLT 176    Cardiac Enzymes: No results for input(s): CKTOTAL, CKMB, CKMBINDEX, TROPONINI in the last 168 hours.  BNP: Invalid input(s): POCBNP  CBG: Recent Labs  Lab 04/25/24 2341  GLUCAP 117*    Microbiology: Results for orders placed or performed during the hospital encounter of 12/31/23  Resp panel by RT-PCR (RSV, Flu A&B, Covid)  Anterior Nasal Swab     Status: None   Collection Time: 12/31/23  4:23 PM   Specimen: Anterior Nasal Swab  Result Value Ref Range Status   SARS Coronavirus 2 by RT PCR NEGATIVE NEGATIVE Final    Comment: (NOTE) SARS-CoV-2 target nucleic acids are NOT DETECTED.  The SARS-CoV-2 RNA is generally detectable in upper respiratory specimens during the acute phase of infection. The lowest concentration of SARS-CoV-2 viral copies this assay can detect is 138 copies/mL. A negative result does not preclude SARS-Cov-2 infection and should not be used as the sole  basis for treatment or other patient management decisions. A negative result may occur with  improper specimen collection/handling, submission of specimen other than nasopharyngeal swab, presence of viral mutation(s) within the areas targeted by this assay, and inadequate number of viral copies(<138 copies/mL). A negative result must be combined with clinical observations, patient history, and epidemiological information. The expected result is Negative.  Fact Sheet for Patients:  bloggercourse.com  Fact Sheet for Healthcare Providers:  seriousbroker.it  This test is no t yet approved or cleared by the [place]  FDA and  has been authorized for detection and/or diagnosis of SARS-CoV-2 by FDA under an Emergency Use Authorization (EUA). This EUA will remain  in effect (meaning this test can be used) for the duration of the COVID-19 declaration under Section 564(b)(1) of the Act, 21 U.S.C.section 360bbb-3(b)(1), unless the authorization is terminated  or revoked sooner.       Influenza A by PCR NEGATIVE NEGATIVE Final   Influenza B by PCR NEGATIVE NEGATIVE Final    Comment: (NOTE) The Xpert Xpress SARS-CoV-2/FLU/RSV plus assay is intended as an aid in the diagnosis of influenza from Nasopharyngeal swab specimens and should not be used as a sole basis for treatment. Nasal washings and aspirates are unacceptable for Xpert Xpress SARS-CoV-2/FLU/RSV testing.  Fact Sheet for Patients: bloggercourse.com  Fact Sheet for Healthcare Providers: seriousbroker.it  This test is not yet approved or cleared by the [place]  FDA and has been authorized for detection and/or diagnosis of SARS-CoV-2 by FDA under an Emergency Use Authorization (EUA). This EUA will remain in effect (meaning this test can be used) for the duration of the COVID-19 declaration under Section 564(b)(1) of the Act,  21 U.S.C. section 360bbb-3(b)(1), unless the authorization is terminated or revoked.     Resp Syncytial Virus by PCR NEGATIVE NEGATIVE Final    Comment: (NOTE) Fact Sheet for Patients: bloggercourse.com  Fact Sheet for Healthcare Providers: seriousbroker.it  This test is not yet approved or cleared by the [place]  FDA and has been authorized for detection and/or diagnosis of SARS-CoV-2 by FDA under an Emergency Use Authorization (EUA). This EUA will remain in effect (meaning this test can be used) for the duration of the COVID-19 declaration under Section 564(b)(1) of the Act, 21 U.S.C. section 360bbb-3(b)(1), unless the authorization is terminated or revoked.  Performed at [facility], [address] [place], [place] [address]     Coagulation Studies: Recent Labs    04/25/24 2354  LABPROT 14.4  INR 1.1    Urinalysis: No results for input(s): COLORURINE, LABSPEC, PHURINE, GLUCOSEU, HGBUR, BILIRUBINUR, KETONESUR, PROTEINUR, UROBILINOGEN, NITRITE, LEUKOCYTESUR in the last 168 hours.  Invalid input(s): APPERANCEUR  Lipid Panel:    Component Value Date/Time   CHOL 130 04/26/2024 0635   CHOL 118 07/08/2021 0811   TRIG 52 04/26/2024 0635   HDL 48 04/26/2024 0635   HDL 52 07/08/2021 0811   CHOLHDL 2.7  04/26/2024 0635   VLDL 10 04/26/2024 0635   LDLCALC 71 04/26/2024 0635   LDLCALC 53 07/08/2021 0811    HgbA1C:  Lab Results  Component Value Date   HGBA1C 5.7 (H) 04/26/2024    Urine Drug Screen:  No results found for: LABOPIA, COCAINSCRNUR, LABBENZ, AMPHETMU, THCU, LABBARB  Alcohol Level:  Recent Labs  Lab 04/25/24 2354  ETH <15    Other results: EKG: sinus or ectopic atrial rhythm at 85 bpm.  Imaging: ECHOCARDIOGRAM COMPLETE BUBBLE STUDY Result Date: 04/26/2024    ECHOCARDIOGRAM REPORT   Patient Name:   TAKUMA CIFELLI Date of Exam: 04/26/2024 Medical Rec  #:  [id number]       Height:       69.0 in Accession #:    [id number]      Weight:       107.6 lb Date of Birth:  [birth year]      BSA:          1.587 m Patient Age:    83 years        BP:           150/90 mmHg Patient Gender: M               HR:           81 bpm. Exam Location:  [facility] Procedure: 2D Echo, Cardiac Doppler, Color Doppler and Saline Contrast Bubble            Study (Both Spectral and Color Flow Doppler were utilized during            procedure). Indications:     Stroke 434.91 / I63.9  History:         Patient has no prior history of Echocardiogram examinations.                  Risk Factors:Hypertension.  Sonographer:     Christopher Furnace Referring Phys:  [id number] DELAYNE LULLA SOLIAN Diagnosing Phys: Keller Paterson  Sonographer Comments: Technically difficult study due to poor echo windows. Image acquisition challenging due to patient body habitus and Pt has pectus excavatum. IMPRESSIONS  1. Technically very difficult and limited study.  2. Left ventricle not well visualized. Preserved systolic function in very limited views.  3. Right ventricular systolic function was not well visualized. The right ventricular size is not well visualized.  4. The mitral valve was not well visualized.  5. The aortic valve was not well visualized. FINDINGS  Left Ventricle: Left ventricle not well visualized. Preserved systolic function in very limited views. Right Ventricle: The right ventricular size is not well visualized. Right vetricular wall thickness was not well visualized. Right ventricular systolic function was not well visualized. Left Atrium: Left atrial size was not well visualized. Right Atrium: Right atrial size was not well visualized. Pericardium: There is no evidence of pericardial effusion. Mitral Valve: The mitral valve was not well visualized. Tricuspid Valve: The tricuspid valve is not well visualized. Aortic Valve: The aortic valve was not well visualized. Pulmonic Valve: The pulmonic valve was not well  visualized. IAS/Shunts: The interatrial septum was not well visualized. Agitated saline contrast was given intravenously to evaluate for intracardiac shunting.  LEFT VENTRICLE PLAX 2D LVIDd:         2.36 cm LVIDs:         2.01 cm LV PW:         1.26 cm LV IVS:        1.79 cm LVOT diam:  2.10 cm LVOT Area:     3.46 cm  LEFT ATRIUM         Index LA diam:    2.80 cm 1.76 cm/m  TRICUSPID VALVE TR Peak grad:   18.7 mmHg TR Vmax:        216.00 cm/s  SHUNTS Systemic Diam: 2.10 cm Keller Paterson Electronically signed by Keller Paterson Signature Date/Time: 04/26/2024/12:12:30 PM    Final    MR BRAIN WO CONTRAST Result Date: 04/26/2024 EXAM: MRI BRAIN WITHOUT CONTRAST 04/26/2024 06:12:51 AM TECHNIQUE: Multiplanar multisequence MRI of the head/brain was performed without the administration of intravenous contrast. COMPARISON: None available. CLINICAL HISTORY: Neuro deficit, acute, stroke suspected. FINDINGS: BRAIN AND VENTRICLES: There are numerous small cortical infarcts involving the cerebral hemispheres bilaterally, most pronounced at the right parietooccipital junction. 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 There is moderate periventricular and subcortical cerebral white matter disease present. There is a cyst within the right choroidal fissure. No intracranial hemorrhage. No mass. No midline shift. No hydrocephalus. The sella is unremarkable. Normal flow voids. ORBITS: The patient is status post bilateral lens replacement. SINUSES AND MASTOIDS: There is mild mucosal disease in the floor of the left maxillary sinus. BONES AND SOFT TISSUES: Normal marrow signal. No soft tissue abnormality. IMPRESSION: 1. 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 2. Moderate  periventricular and subcortical cerebral white matter disease. Electronically signed by: Evalene Coho MD 04/26/2024 07:35 AM EST RP Workstation: [id number]   CT ANGIO HEAD NECK W WO CM W PERF (CODE STROKE) Result Date: 04/26/2024 CLINICAL DATA:  Initial evaluation for acute neuro deficit, stroke. EXAM: CT ANGIOGRAPHY HEAD AND NECK CT PERFUSION BRAIN TECHNIQUE: Multidetector CT imaging of the head and neck was performed using the standard protocol during bolus administration of intravenous contrast. Multiplanar CT image reconstructions and MIPs were obtained to evaluate the vascular anatomy. Carotid stenosis measurements (when applicable) are obtained utilizing NASCET criteria, using the distal internal carotid diameter as the denominator. Multiphase CT imaging of the brain was performed following IV bolus contrast injection. Subsequent parametric perfusion maps were calculated using RAPID software. RADIATION DOSE REDUCTION: This exam was performed according to the departmental dose-optimization program which includes automated exposure control, adjustment of the mA and/or kV according to patient size and/or use of iterative reconstruction technique. CONTRAST:  OMNIPAQUE  IOHEXOL  350 MG/ML SOLN COMPARISON:  CT from earlier the same day. FINDINGS: CTA NECK FINDINGS Aortic arch: Visualized arch within normal limits for caliber standard branch pattern. Aortic atherosclerosis. No significant stenosis about the origin the great vessels. Right carotid system: No evidence of dissection, stenosis (50% or greater) or occlusion. Left carotid system: No evidence of dissection, stenosis (50% or greater) or occlusion. Vertebral arteries: Both vertebral arteries arise from subclavian arteries. Vertebral arteries are patent without significant stenosis or dissection. Skeleton: No worrisome osseous lesions. Other neck: .  No other acute finding. Upper chest: Sequelae of prior esophagectomy with gastric pull-through  partially visualized. No other acute finding. Review of the MIP images confirms the above findings CTA HEAD FINDINGS Anterior circulation: Moderate atheromatous change about the carotid siphons bilaterally. Resultant up to moderate stenosis on the right with no more than mild narrowing on the left. A1 segments, anterior communicating complex common anterior cerebral arteries patent without stenosis. Normal in stenosis or occlusion. No proximal supra occlusion or high-grade stenosis.  Distal MCA branches perfused and symmetric. Posterior circulation: Both V4 segments patent without significant stenosis. Left PICA patent at its origin. Right PICA origin not well seen. Basilar patent without stenosis. Superior cerebellar and posterior cerebral arteries patent bilaterally. Venous sinuses: Patent allowing for timing the contrast bolus. Anatomic variants: None significant.  No aneurysm. Review of the MIP images confirms the above findings CT Brain Perfusion Findings: ASPECTS: 10 CBF (<30%) Volume: 0mL Perfusion (Tmax>6.0s) volume: 0mL Mismatch Volume: 0mL Infarction Location:Negative CT perfusion with no evidence for acute ischemia or other perfusion abnormality. IMPRESSION: 1. Negative CTA for large vessel occlusion or other emergent finding. 2. Negative CT perfusion with no evidence for acute ischemia or other perfusion abnormality. 3. Moderate atheromatous change about the carotid siphons with resultant moderate stenosis on the right. 4. Sequelae of prior esophagectomy with gastric pull-through, partially visualized. Aortic Atherosclerosis (ICD10-I70.0). Electronically Signed   By: Morene Hoard M.D.   On: 04/26/2024 00:38   CT HEAD CODE STROKE WO CONTRAST Result Date: 04/26/2024 CLINICAL DATA:  Code stroke. Initial evaluation for acute neuro deficit, stroke, aphasia, slurred speech. EXAM: CT HEAD WITHOUT CONTRAST TECHNIQUE: Contiguous axial images were obtained from the base of the skull through the vertex  without intravenous contrast. RADIATION DOSE REDUCTION: This exam was performed according to the departmental dose-optimization program which includes automated exposure control, adjustment of the mA and/or kV according to patient size and/or use of iterative reconstruction technique. COMPARISON:  None Available. FINDINGS: Brain: Cerebral volume within normal limits. Changes of mild chronic microvascular ischemic disease noted. No acute intracranial hemorrhage. No acute large vessel territory infarct. Calcific density adjacent to the superior sagittal sinus near the skull vertex noted, favored to reflect a dural calcification. No mass lesion, midline shift or mass effect no hydrocephalus or extra-axial fluid collection. Vascular: No abnormal hyperdense vessel. Calcified atherosclerosis present at the skull base. Skull: Scalp soft tissues within normal limits.  Calvarium intact. Sinuses/Orbits: Globes and orbital soft tissues within normal limits. Paranasal sinuses are largely clear. No significant mastoid effusion. Other: None ASPECTS (Alberta Stroke Program Early CT Score) - Ganglionic level infarction (caudate, lentiform nuclei, internal capsule, insula, M1-M3 cortex): 7 - Supraganglionic infarction (M4-M6 cortex): 3 Total score (0-10 with 10 being normal): 10 IMPRESSION: 1. No acute intracranial abnormality. 2. Aspects is 10. 3. Mild chronic microvascular ischemic disease. Results were called by telephone at the time of interpretation on 04/26/2024 at 12:00 am to provider PHILLIP STAFFORD , who verbally acknowledged these results. Electronically Signed   By: Morene Hoard M.D.   On: 04/26/2024 00:00    Assessment: 84 y.o. male with a history of HTN and esophageal cancer who presented with complaints of slurred speech.  MRI of the brain personally reviewed and reveals small, bilateral hemispheric acute infarcts.  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  Echocardiogram technically difficulty and limited.   CTA of  the head and neck show no hemodynamically significant stenosis.  A1c 5.7, LDL 71. BP controlled  Stroke Risk Factors - hypertension  Plan: 1. Agree with high intensity statin initiation.   2. Dual antiplatelet therapy with ASA 81mg  and Plavix  75mg  for three weeks with change to ASA 81mg  daily alone as monotherapy after that time. 3. Speech therapy 4. Due to limitations of TTE, agree with TEE.  Cardiology evaluating patient. 5. Continued telemetry 6. If inpatient work up unremarkable patient to have prolonged cardiac monitoring on an outpatient basis.    7. Continued neuro checks  Case discussed with Dr. Von Sonny Hock,  MD Neurology  04/26/2024, 2:05 PM          [1]  Allergies Allergen Reactions   Codeine Phosphate [Codeine] Other (See Comments) and Hives    Dizziness

## 2024-04-26 NOTE — Plan of Care (Signed)
" °  Problem: Education: Goal: Knowledge of disease or condition will improve Outcome: Progressing   Problem: Coping: Goal: Will verbalize positive feelings about self Outcome: Progressing   Problem: Health Behavior/Discharge Planning: Goal: Ability to manage health-related needs will improve Outcome: Progressing   Problem: Self-Care: Goal: Ability to participate in self-care as condition permits will improve Outcome: Progressing   Problem: Nutrition: Goal: Risk of aspiration will decrease Outcome: Progressing   Problem: Education: Goal: Knowledge of General Education information will improve Description: Including pain rating scale, medication(s)/side effects and non-pharmacologic comfort measures Outcome: Progressing   Problem: Health Behavior/Discharge Planning: Goal: Ability to manage health-related needs will improve Outcome: Progressing   Problem: Clinical Measurements: Goal: Ability to maintain clinical measurements within normal limits will improve Outcome: Progressing   Problem: Activity: Goal: Risk for activity intolerance will decrease Outcome: Progressing   Problem: Nutrition: Goal: Adequate nutrition will be maintained Outcome: Progressing   Problem: Coping: Goal: Level of anxiety will decrease Outcome: Progressing   Problem: Pain Managment: Goal: General experience of comfort will improve and/or be controlled Outcome: Progressing   Problem: Safety: Goal: Ability to remain free from injury will improve Outcome: Progressing   Problem: Skin Integrity: Goal: Risk for impaired skin integrity will decrease Outcome: Progressing   "

## 2024-04-26 NOTE — Progress Notes (Signed)
*  PRELIMINARY RESULTS* Echocardiogram 2D Echocardiogram has been performed.  [name] 04/26/2024, 9:11 AM

## 2024-04-27 ENCOUNTER — Other Ambulatory Visit: Payer: Self-pay

## 2024-04-27 LAB — PHOSPHORUS: Phosphorus: 2.5 mg/dL (ref 2.5–4.6)

## 2024-04-27 LAB — CBC
HCT: 44.6 % (ref 39.0–52.0)
Hemoglobin: 15.6 g/dL (ref 13.0–17.0)
MCH: 32 pg (ref 26.0–34.0)
MCHC: 35 g/dL (ref 30.0–36.0)
MCV: 91.4 fL (ref 80.0–100.0)
Platelets: 151 10*3/uL (ref 150–400)
RBC: 4.88 MIL/uL (ref 4.22–5.81)
RDW: 12.6 % (ref 11.5–15.5)
WBC: 8.2 10*3/uL (ref 4.0–10.5)
nRBC: 0 % (ref 0.0–0.2)

## 2024-04-27 LAB — BASIC METABOLIC PANEL WITH GFR
Anion gap: 9 (ref 5–15)
BUN: 19 mg/dL (ref 8–23)
CO2: 25 mmol/L (ref 22–32)
Calcium: 8.7 mg/dL — ABNORMAL LOW (ref 8.9–10.3)
Chloride: 103 mmol/L (ref 98–111)
Creatinine, Ser: 0.8 mg/dL (ref 0.61–1.24)
GFR, Estimated: >60 mL/min
Glucose, Bld: 116 mg/dL — ABNORMAL HIGH (ref 70–99)
Potassium: 3.1 mmol/L — ABNORMAL LOW (ref 3.5–5.1)
Sodium: 137 mmol/L (ref 135–145)

## 2024-04-27 LAB — MAGNESIUM: Magnesium: 2.3 mg/dL (ref 1.7–2.4)

## 2024-04-27 MED ORDER — POTASSIUM CHLORIDE 20 MEQ PO PACK
40.0000 meq | PACK | ORAL | Status: AC
  Administered 2024-04-27: 40 meq via ORAL
  Filled 2024-04-27: qty 2

## 2024-04-27 MED ORDER — ONDANSETRON HCL 4 MG/2ML IJ SOLN
4.0000 mg | Freq: Four times a day (QID) | INTRAMUSCULAR | Status: DC | PRN
  Administered 2024-04-27: 4 mg via INTRAVENOUS
  Filled 2024-04-27: qty 2

## 2024-04-27 MED ORDER — ASPIRIN 81 MG PO TBEC
81.0000 mg | DELAYED_RELEASE_TABLET | Freq: Every day | ORAL | 11 refills | Status: AC
  Filled 2024-04-27: qty 30, 30d supply, fill #0

## 2024-04-27 MED ORDER — PANTOPRAZOLE SODIUM 40 MG PO TBEC
40.0000 mg | DELAYED_RELEASE_TABLET | Freq: Every day | ORAL | 1 refills | Status: AC
  Filled 2024-04-27: qty 30, 30d supply, fill #0

## 2024-04-27 MED ORDER — CLOPIDOGREL BISULFATE 75 MG PO TABS
75.0000 mg | ORAL_TABLET | Freq: Every day | ORAL | 0 refills | Status: AC
  Filled 2024-04-27: qty 20, 20d supply, fill #0

## 2024-04-27 MED ORDER — ATORVASTATIN CALCIUM 40 MG PO TABS
40.0000 mg | ORAL_TABLET | Freq: Every evening | ORAL | 5 refills | Status: AC
  Filled 2024-04-27: qty 30, 30d supply, fill #0

## 2024-04-27 MED ORDER — METOPROLOL TARTRATE 25 MG PO TABS
12.5000 mg | ORAL_TABLET | Freq: Two times a day (BID) | ORAL | 11 refills | Status: AC
  Filled 2024-04-27: qty 60, 60d supply, fill #0

## 2024-04-27 MED ORDER — METOPROLOL SUCCINATE ER 25 MG PO TB24
25.0000 mg | ORAL_TABLET | Freq: Every day | ORAL | 3 refills | Status: DC
  Filled 2024-04-27: qty 90, 90d supply, fill #0

## 2024-04-27 MED ORDER — POTASSIUM CHLORIDE CRYS ER 20 MEQ PO TBCR
40.0000 meq | EXTENDED_RELEASE_TABLET | Freq: Once | ORAL | 0 refills | Status: AC
  Filled 2024-04-27: qty 2, 1d supply, fill #0

## 2024-04-27 NOTE — Plan of Care (Signed)
" °  Problem: Education: Goal: Knowledge of disease or condition will improve Outcome: Progressing Goal: Knowledge of secondary prevention will improve (MUST DOCUMENT ALL) Outcome: Progressing Goal: Knowledge of patient specific risk factors will improve (DELETE if not current risk factor) Outcome: Progressing   Problem: Ischemic Stroke/TIA Tissue Perfusion: Goal: Complications of ischemic stroke/TIA will be minimized Outcome: Progressing   Problem: Coping: Goal: Will verbalize positive feelings about self Outcome: Progressing Goal: Will identify appropriate support needs Outcome: Progressing   Problem: Health Behavior/Discharge Planning: Goal: Ability to manage health-related needs will improve Outcome: Progressing Goal: Goals will be collaboratively established with patient/family Outcome: Progressing   Problem: Self-Care: Goal: Ability to participate in self-care as condition permits will improve Outcome: Progressing   Problem: Nutrition: Goal: Risk of aspiration will decrease Outcome: Progressing Goal: Dietary intake will improve Outcome: Progressing   Problem: Education: Goal: Knowledge of General Education information will improve Description: Including pain rating scale, medication(s)/side effects and non-pharmacologic comfort measures Outcome: Progressing   Problem: Health Behavior/Discharge Planning: Goal: Ability to manage health-related needs will improve Outcome: Progressing   Problem: Clinical Measurements: Goal: Ability to maintain clinical measurements within normal limits will improve Outcome: Progressing Goal: Will remain free from infection Outcome: Progressing Goal: Diagnostic test results will improve Outcome: Progressing Goal: Respiratory complications will improve Outcome: Progressing Goal: Cardiovascular complication will be avoided Outcome: Progressing   Problem: Activity: Goal: Risk for activity intolerance will decrease Outcome:  Progressing   Problem: Nutrition: Goal: Adequate nutrition will be maintained Outcome: Progressing   Problem: Coping: Goal: Level of anxiety will decrease Outcome: Progressing   "

## 2024-04-27 NOTE — Discharge Summary (Incomplete)
 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 Go in 1 week(s).   Specialty: Cardiology Contact information: [address] [facility] [place] [address] [phone]         Bertrum Charlie CROME, MD Follow up in 1 week(s).   Specialty: Family Medicine Contact information: [address] [place] [address] [phone]                Diet recommendation: Cardiac diet  Activity: The patient is advised to gradually reintroduce usual activities, as tolerated  Discharge Condition: stable  Code Status: Full code   History of present illness: As per the H and P dictated on admission.  Hospital Course:  [name] is a 84 y.o. male with medical history significant for GIST s/p transhiatal esophagogastrectomy (2003), esophageal stricture s/p dilatation, HTN, nephrolithiasis, recently evaluated by cardiology for palpitations-Holter showing predominant sinus bradycardia/type I second-degree AV block/occasional PVCs and PACs, being admitted for stroke workup after presenting as a code stroke.  Patient had  onset of aphasia and slurred speech starting an hour prior to arrival associated with dizziness and generalized weakness. Code stroke was activated upon arrival.  CT head was nonacute And CT head and neck negative for LVO or other emergent finding.  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 He was seen in consultation by teleneurologist with an NIHSS of 2 for subtle dysarthria and as such thrombolytics were not indicated due to nondisabling deficits. Recommendation for clopidogrel  300 mg bolus and initiation of dual antiplatelet therapy with aspirin  and Plavix  and admission for stroke workup Additional ED workup: Vitals with BP 164/104 otherwise unremarkable Labs notable for mild hypokalemia otherwise normal CBC, CMP INR and EtOH EKG showing sinus at 85 with incomplete RBBB and LAFB and prolonged QT   Admission requested     Assessment and Plan:   # Acute CVA (cerebrovascular accident) Presented as a code stroke, seen by teleneurology in the ED Loaded with Plavix  in the ED Permissive hypertension for first 24-48 hrs post stroke onset: s/p Prn Labetalol IV or Vasotec IV If BP greater than 220/120  Statins for LDL goal less than 70 ASA 81mg  daily, Plavix  75mg  daily x 3 weeks then monotherapy aspirin  81 mg p.o. daily thereafter S/p Telemetry, TTE: LV systolic function not well-visualized, technically difficult and limited study.  MRI Brain: 1. 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 2. Moderate periventricular and subcortical cerebral white matter disease. Neurology consulted, recommended DAPT for 3 weeks followed by aspirin  81 mg p.o. daily.  Cardiac monitor and follow-up as  an outpatient. Cardiology was consulted for thromboembolic source, initially recommended TEE but patient is not a good  candidate due to history of esophageal cancer.  Cardiac monitor was placed and patient was recommended to follow-up with cardiology as an outpatient.  Patient may need DOAC if thromboembolic source versus arrhythmia. Patient was advised to follow-up with heme-onc as well for hypercoagulable workup as an outpatient      # Second degree atrioventricular block, Mobitz (type) I History of palpitations Evaluated by cardiology December 2025 S/p Holter 01/2024 revealed predominant sinus bradycardia, occasional PVCs and PACs, and occasional dropped beats due to type I second-degree AV block  S/p telemetry  # Hypertension: s/p permissive hypertension Resumed home dose amlodipine , decreased Toprol -XL, started on low-dose metoprolol  12.5 mg p.o. twice daily with holding parameters.  Patient was advised to monitor BP and heart rate at home and follow with cardiology as an outpatient.   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 Follow-up with heme-onc as an outpatient for hypercoagulable workup.    # Underweight, severe protein calorie malnutrition Body mass index is 15.89 kg/m.  Nutrition Problem: Severe Malnutrition Etiology: chronic illness (GIST s/p transhiatak esophogastrectomy) Nutrition Interventions: Interventions: Ensure Enlive (each supplement provides 350kcal and 20 grams of protein), MVI, Liberalize Diet   Patient was seen by physical therapy, who recommended no therapy needed on discharge  On the day of the discharge the patient's vitals were stable, and no other acute medical condition were reported by patient. the patient was felt safe to be discharge at Home with Home health.  Consultants: Neurology and cardiology Procedures: None  Discharge Exam: General: Appear in no distress, Oral Mucosa Clear, moist. Cardiovascular: S1 and S2 Present, no Murmur, Respiratory:  normal respiratory effort, Bilateral Air entry present and no Crackles, no wheezes Abdomen: Bowel Sound present, Soft and no tenderness. Extremities: no Pedal edema, no calf tenderness Neurology: alert and oriented to time, place, and person affect appropriate.  Filed Weights   04/26/24 0022  Weight: 48.8 kg   Vitals:   04/27/24 0400 04/27/24 0755  BP: (!) 151/89 (!) 150/91  Pulse: 72 65  Resp:  16  Temp: 97.9 F (36.6 C) 97.8 F (36.6 C)  SpO2: 99% 98%    DISCHARGE MEDICATION: Allergies as of 04/27/2024       Reactions   Codeine Phosphate [codeine] Other (See Comments), Hives   Dizziness        Medication List     STOP taking these medications    metoprolol  succinate 50 MG 24 hr tablet Commonly known as: TOPROL -XL       TAKE these medications    acidophilus Caps capsule Take 1 capsule by mouth daily.   amLODipine -benazepril  5-20 MG capsule Commonly known as: LOTREL TAKE 1 CAPSULE BY MOUTH DAILY   aspirin  EC 81 MG tablet Take 1 tablet (81 mg total) by mouth daily. Swallow whole.   atorvastatin  40 MG tablet Commonly known as: LIPITOR Take 1 tablet (40 mg total) by mouth every evening.   clopidogrel  75 MG tablet Commonly known as: PLAVIX  Take 1 tablet (75 mg total) by mouth daily for 20 days.   metoprolol  tartrate 25 MG tablet Commonly known as: LOPRESSOR  Take 1/2 tablets (12.5 mg total) by mouth 2 (two) times daily.   multivitamin with minerals Tabs tablet Take 1 tablet by mouth daily.   OVER THE COUNTER MEDICATION daily. Morning kick (powder) by [facility]  pantoprazole  40 MG tablet Commonly known as: PROTONIX  Take 1 tablet (40 mg total) by mouth daily.   potassium chloride  SA 20 MEQ tablet Commonly known as: KLOR-CON  M Take 2 tablets (40 mEq total) by mouth once.       Allergies[1] Discharge Instructions     Call MD for:   Complete by: As directed    Weakness or numbness   Call MD for:  difficulty breathing, headache or visual  disturbances   Complete by: As directed    Call MD for:  extreme fatigue   Complete by: As directed    Call MD for:  persistant dizziness or light-headedness   Complete by: As directed    Call MD for:  persistant nausea and vomiting   Complete by: As directed    Discharge instructions   Complete by: As directed    F/u PCP in 1 wk F/u Neuro in  1-2 wks F/u Cardio in 1-2 weeks   Increase activity slowly   Complete by: As directed        The results of significant diagnostics from this hospitalization (including imaging, microbiology, ancillary and laboratory) are listed below for reference.    Significant Diagnostic Studies: ECHOCARDIOGRAM COMPLETE BUBBLE STUDY Result Date: 04/26/2024    ECHOCARDIOGRAM REPORT   Patient Name:   JONPAUL LUMM Date of Exam: 04/26/2024 Medical Rec #:  [id number]       Height:       69.0 in Accession #:    [id number]      Weight:       107.6 lb Date of Birth:  08-31-1940      BSA:          1.587 m Patient Age:    83 years        BP:           150/90 mmHg Patient Gender: M               HR:           81 bpm. Exam Location:  [facility] Procedure: 2D Echo, Cardiac Doppler, Color Doppler and Saline Contrast Bubble            Study (Both Spectral and Color Flow Doppler were utilized during            procedure). Indications:     Stroke 434.91 / I63.9  History:         Patient has no prior history of Echocardiogram examinations.                  Risk Factors:Hypertension.  Sonographer:     Christopher Furnace Referring Phys:  [id number] DELAYNE LULLA SOLIAN Diagnosing Phys: Keller Paterson  Sonographer Comments: Technically difficult study due to poor echo windows. Image acquisition challenging due to patient body habitus and Pt has pectus excavatum. IMPRESSIONS  1. Technically very difficult and limited study.  2. Left ventricle not well visualized. Preserved systolic function in very limited views.  3. Right ventricular systolic function was not well visualized. The right ventricular size is  not well visualized.  4. The mitral valve was not well visualized.  5. The aortic valve was not well visualized. FINDINGS  Left Ventricle: Left ventricle not well visualized. Preserved systolic function in very limited views. Right Ventricle: The right ventricular size is not well visualized. Right vetricular wall thickness was not well visualized. Right ventricular systolic function was not well visualized. Left Atrium: Left atrial size was not well visualized.  Right Atrium: Right atrial size was not well visualized. Pericardium: There is no evidence of pericardial effusion. Mitral Valve: The mitral valve was not well visualized. Tricuspid Valve: The tricuspid valve is not well visualized. Aortic Valve: The aortic valve was not well visualized. Pulmonic Valve: The pulmonic valve was not well visualized. IAS/Shunts: The interatrial septum was not well visualized. Agitated saline contrast was given intravenously to evaluate for intracardiac shunting.  LEFT VENTRICLE PLAX 2D LVIDd:         2.36 cm LVIDs:         2.01 cm LV PW:         1.26 cm LV IVS:        1.79 cm LVOT diam:     2.10 cm LVOT Area:     3.46 cm  LEFT ATRIUM         Index LA diam:    2.80 cm 1.76 cm/m  TRICUSPID VALVE TR Peak grad:   18.7 mmHg TR Vmax:        216.00 cm/s  SHUNTS Systemic Diam: 2.10 cm Keller Paterson Electronically signed by Keller Paterson Signature Date/Time: 04/26/2024/12:12:30 PM    Final    MR BRAIN WO CONTRAST Result Date: 04/26/2024 EXAM: MRI BRAIN WITHOUT CONTRAST 04/26/2024 06:12:51 AM TECHNIQUE: Multiplanar multisequence MRI of the head/brain was performed without the administration of intravenous contrast. COMPARISON: None available. CLINICAL HISTORY: Neuro deficit, acute, stroke suspected. FINDINGS: BRAIN AND VENTRICLES: There are numerous small cortical infarcts involving the cerebral hemispheres bilaterally, most pronounced at the right parietooccipital junction. The infarcts involving both the anterior and posterior  circulations and likely represent embolic infarcts. There is moderate periventricular and subcortical cerebral white matter disease present. There is a cyst within the right choroidal fissure. No intracranial hemorrhage. No mass. No midline shift. No hydrocephalus. The sella is unremarkable. Normal flow voids. ORBITS: The patient is status post bilateral lens replacement. SINUSES AND MASTOIDS: There is mild mucosal disease in the floor of the left maxillary sinus. BONES AND SOFT TISSUES: Normal marrow signal. No soft tissue abnormality. IMPRESSION: 1. 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 2. Moderate periventricular and subcortical cerebral white matter disease. Electronically signed by: Evalene Coho MD 04/26/2024 07:35 AM EST RP Workstation: [id number]   CT ANGIO HEAD NECK W WO CM W PERF (CODE STROKE) Result Date: 04/26/2024 CLINICAL DATA:  Initial evaluation for acute neuro deficit, stroke. EXAM: CT ANGIOGRAPHY HEAD AND NECK CT PERFUSION BRAIN TECHNIQUE: Multidetector CT imaging of the head and neck was performed using the standard protocol during bolus administration of intravenous contrast. Multiplanar CT image reconstructions and MIPs were obtained to evaluate the vascular anatomy. Carotid stenosis measurements (when applicable) are obtained utilizing NASCET criteria, using the distal internal carotid diameter as the denominator. Multiphase CT imaging of the brain was performed following IV bolus contrast injection. Subsequent parametric perfusion maps were calculated using RAPID software. RADIATION DOSE REDUCTION: This exam was performed according to the departmental dose-optimization program which includes automated exposure control, adjustment of the mA and/or kV according to patient size and/or use of iterative  reconstruction technique. CONTRAST:  OMNIPAQUE  IOHEXOL  350 MG/ML SOLN COMPARISON:  CT from earlier the same day. FINDINGS: CTA NECK FINDINGS Aortic arch: Visualized arch within normal limits for caliber standard branch pattern. Aortic atherosclerosis. No significant stenosis about the origin the great vessels. Right carotid system: No evidence of dissection,  stenosis (50% or greater) or occlusion. Left carotid system: No evidence of dissection, stenosis (50% or greater) or occlusion. Vertebral arteries: Both vertebral arteries arise from subclavian arteries. Vertebral arteries are patent without significant stenosis or dissection. Skeleton: No worrisome osseous lesions. Other neck: .  No other acute finding. Upper chest: Sequelae of prior esophagectomy with gastric pull-through partially visualized. No other acute finding. Review of the MIP images confirms the above findings CTA HEAD FINDINGS Anterior circulation: Moderate atheromatous change about the carotid siphons bilaterally. Resultant up to moderate stenosis on the right with no more than mild narrowing on the left. A1 segments, anterior communicating complex common anterior cerebral arteries patent without stenosis. Normal in stenosis or occlusion. No proximal supra occlusion or high-grade stenosis. Distal MCA branches perfused and symmetric. Posterior circulation: Both V4 segments patent without significant stenosis. Left PICA patent at its origin. Right PICA origin not well seen. Basilar patent without stenosis. Superior cerebellar and posterior cerebral arteries patent bilaterally. Venous sinuses: Patent allowing for timing the contrast bolus. Anatomic variants: None significant.  No aneurysm. Review of the MIP images confirms the above findings CT Brain Perfusion Findings: ASPECTS: 10 CBF (<30%) Volume: 0mL Perfusion (Tmax>6.0s) volume: 0mL Mismatch Volume: 0mL Infarction Location:Negative CT perfusion with no evidence for acute ischemia or other  perfusion abnormality. IMPRESSION: 1. Negative CTA for large vessel occlusion or other emergent finding. 2. Negative CT perfusion with no evidence for acute ischemia or other perfusion abnormality. 3. Moderate atheromatous change about the carotid siphons with resultant moderate stenosis on the right. 4. Sequelae of prior esophagectomy with gastric pull-through, partially visualized. Aortic Atherosclerosis (ICD10-I70.0). Electronically Signed   By: Morene Hoard M.D.   On: 04/26/2024 00:38   CT HEAD CODE STROKE WO CONTRAST Result Date: 04/26/2024 CLINICAL DATA:  Code stroke. Initial evaluation for acute neuro deficit, stroke, aphasia, slurred speech. EXAM: CT HEAD WITHOUT CONTRAST TECHNIQUE: Contiguous axial images were obtained from the base of the skull through the vertex without intravenous contrast. RADIATION DOSE REDUCTION: This exam was performed according to the departmental dose-optimization program which includes automated exposure control, adjustment of the mA and/or kV according to patient size and/or use of iterative reconstruction technique. COMPARISON:  None Available. FINDINGS: Brain: Cerebral volume within normal limits. Changes of mild chronic microvascular ischemic disease noted. No acute intracranial hemorrhage. No acute large vessel territory infarct. Calcific density adjacent to the superior sagittal sinus near the skull vertex noted, favored to reflect a dural calcification. No mass lesion, midline shift or mass effect no hydrocephalus or extra-axial fluid collection. Vascular: No abnormal hyperdense vessel. Calcified atherosclerosis present at the skull base. Skull: Scalp soft tissues within normal limits.  Calvarium intact. Sinuses/Orbits: Globes and orbital soft tissues within normal limits. Paranasal sinuses are largely clear. No significant mastoid effusion. Other: None ASPECTS (Alberta Stroke Program Early CT Score) - Ganglionic level infarction (caudate, lentiform nuclei,  internal capsule, insula, M1-M3 cortex): 7 - Supraganglionic infarction (M4-M6 cortex): 3 Total score (0-10 with 10 being normal): 10 IMPRESSION: 1. No acute intracranial abnormality. 2. Aspects is 10. 3. Mild chronic microvascular ischemic disease. Results were called by telephone at the time of interpretation on 04/26/2024 at 12:00 am to provider PHILLIP STAFFORD , who verbally acknowledged these results. Electronically Signed   By: Morene Hoard M.D.   On: 04/26/2024 00:00    Microbiology: No results found for this or any previous visit (from the past 240 hours).   Labs: CBC: Recent Labs  Lab 04/25/24 2354 04/27/24 9381  WBC 8.3 8.2  NEUTROABS 5.1  --   HGB 16.7 15.6  HCT 47.7 44.6  MCV 92.3 91.4  PLT 176 151   Basic Metabolic Panel: Recent Labs  Lab 04/25/24 2354 04/27/24 0618  NA 141 137  K 3.3* 3.1*  CL 106 103  CO2 23 25  GLUCOSE 151* 116*  BUN 27* 19  CREATININE 1.01 0.80  CALCIUM  9.1 8.7*  MG  --  2.3  PHOS  --  2.5   Liver Function Tests: Recent Labs  Lab 04/25/24 2354  AST 24  ALT 19  ALKPHOS 112  BILITOT 1.6*  PROT 6.8  ALBUMIN 4.3   No results for input(s): LIPASE, AMYLASE in the last 168 hours. No results for input(s): AMMONIA in the last 168 hours. Cardiac Enzymes: No results for input(s): CKTOTAL, CKMB, CKMBINDEX, TROPONINI in the last 168 hours. BNP (last 3 results) Recent Labs    12/31/23 1215  BNP 49.0   CBG: Recent Labs  Lab 04/25/24 2341 04/26/24 1616  GLUCAP 117* 161*    Time spent: 35 minutes  Signed:  Elvan Sor  [facility] 1/30/20261/31/2026 5:43 PM      [1]  Allergies Allergen Reactions   Codeine Phosphate [Codeine] Other (See Comments) and Hives    Dizziness
